# Patient Record
Sex: Female | Born: 1990 | Race: White | Hispanic: No | Marital: Single | Smoking: Never smoker
Health system: Southern US, Community
[De-identification: ages and names within clinical notes are randomized; demographics above are authoritative.]

## PROBLEM LIST (undated history)

## (undated) ENCOUNTER — Inpatient Hospital Stay (HOSPITAL_COMMUNITY): Payer: Self-pay

## (undated) DIAGNOSIS — T7840XA Allergy, unspecified, initial encounter: Secondary | ICD-10-CM

## (undated) DIAGNOSIS — D649 Anemia, unspecified: Secondary | ICD-10-CM

## (undated) DIAGNOSIS — O039 Complete or unspecified spontaneous abortion without complication: Secondary | ICD-10-CM

## (undated) DIAGNOSIS — M1990 Unspecified osteoarthritis, unspecified site: Secondary | ICD-10-CM

## (undated) HISTORY — PX: DILATION AND CURETTAGE OF UTERUS: SHX78

## (undated) HISTORY — DX: Unspecified osteoarthritis, unspecified site: M19.90

## (undated) HISTORY — DX: Anemia, unspecified: D64.9

## (undated) HISTORY — DX: Allergy, unspecified, initial encounter: T78.40XA

---
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
## 2009-06-10 ENCOUNTER — Other Ambulatory Visit: Admission: RE | Admit: 2009-06-10 | Discharge: 2009-06-10 | Payer: Self-pay | Admitting: Family Medicine

## 2009-08-05 ENCOUNTER — Encounter: Admission: RE | Admit: 2009-08-05 | Discharge: 2009-08-05 | Payer: Self-pay | Admitting: Family Medicine

## 2010-06-25 ENCOUNTER — Encounter: Payer: Self-pay | Admitting: *Deleted

## 2010-09-15 LAB — POCT I-STAT, CHEM 8
BUN: 10 mg/dL (ref 6–23)
Calcium, Ion: 1.23 mmol/L (ref 1.12–1.32)
Chloride: 103 mEq/L (ref 96–112)
Creatinine, Ser: 0.8 mg/dL (ref 0.4–1.2)
Glucose, Bld: 92 mg/dL (ref 70–99)
HCT: 43 % (ref 36.0–46.0)
Hemoglobin: 14.6 g/dL (ref 12.0–15.0)
Potassium: 4.3 mEq/L (ref 3.5–5.1)
Sodium: 138 mEq/L (ref 135–145)
TCO2: 26 mmol/L (ref 0–100)

## 2010-09-15 LAB — DIFFERENTIAL
Basophils Absolute: 0 10*3/uL (ref 0.0–0.1)
Basophils Relative: 0 % (ref 0–1)
Eosinophils Absolute: 0 10*3/uL (ref 0.0–0.7)
Eosinophils Relative: 0 % (ref 0–5)
Lymphocytes Relative: 23 % (ref 12–46)
Lymphs Abs: 1.8 10*3/uL (ref 0.7–4.0)
Monocytes Absolute: 0.7 10*3/uL (ref 0.1–1.0)
Monocytes Relative: 9 % (ref 3–12)
Neutro Abs: 5.3 10*3/uL (ref 1.7–7.7)
Neutrophils Relative %: 68 % (ref 43–77)

## 2010-09-15 LAB — CBC
HCT: 40.2 % (ref 36.0–46.0)
Hemoglobin: 13.9 g/dL (ref 12.0–15.0)
MCHC: 34.7 g/dL (ref 30.0–36.0)
MCV: 91.9 fL (ref 78.0–100.0)
Platelets: 193 10*3/uL (ref 150–400)
RBC: 4.38 MIL/uL (ref 3.87–5.11)
RDW: 12.9 % (ref 11.5–15.5)
WBC: 7.7 10*3/uL (ref 4.0–10.5)

## 2010-09-15 LAB — D-DIMER, QUANTITATIVE: D-Dimer, Quant: <0.22 ug/mL-FEU (ref 0.00–0.48)

## 2011-07-01 IMAGING — CT CT HEAD W/O CM
2 series · 16 of 30 positions shown, 20 images · non-contrast
Comparison: None

CLINICAL DATA: Headache.

CT HEAD WITHOUT CONTRAST
TECHNIQUE: Contiguous axial images were obtained from the base of
the skull through the vertex without contrast.

[Series 2: head wo · axial · 0.49mm/px · z∈[+380,+500]mm · 13 of 28 slices shown, 17 images]
[im 2/28  brain]
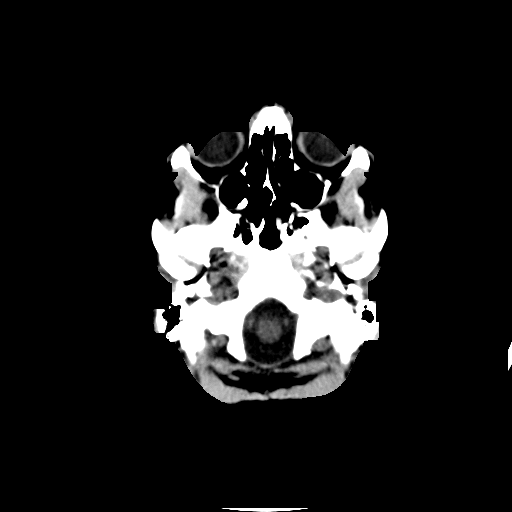
[im 2/28  bone]
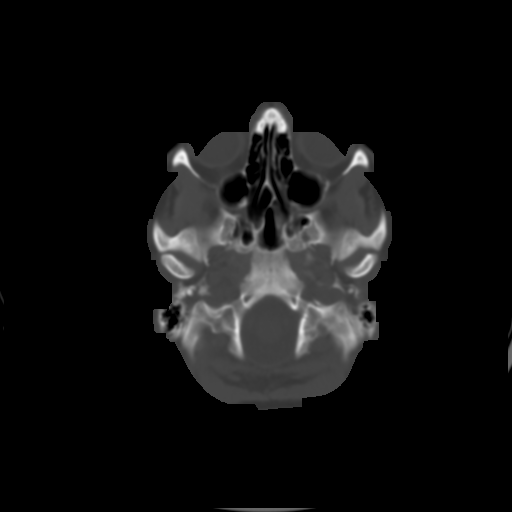
[im 4/28  brain]
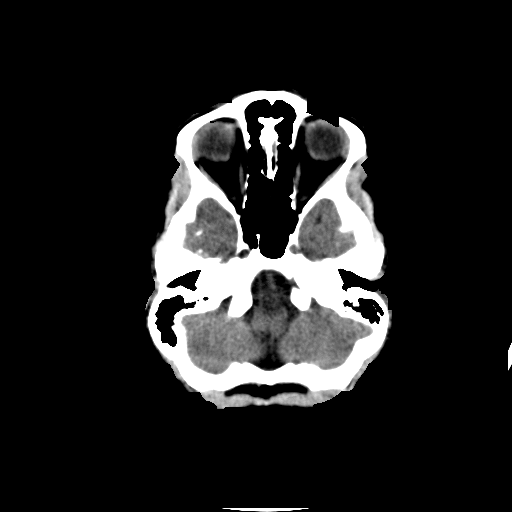
[im 6/28  brain]
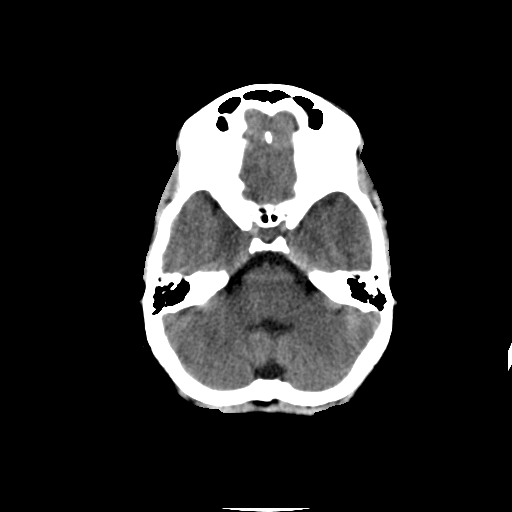
[im 8/28  brain]
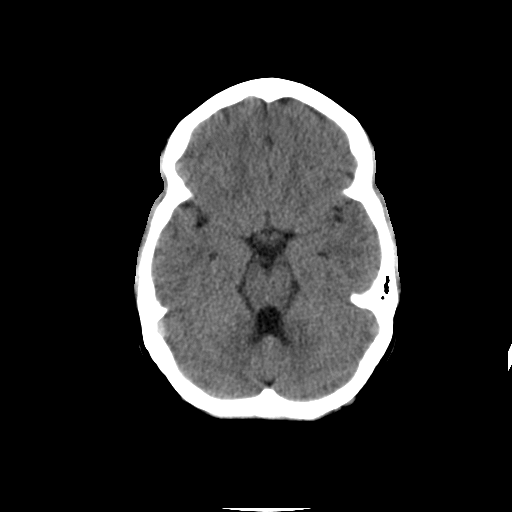
[im 10/28  brain]
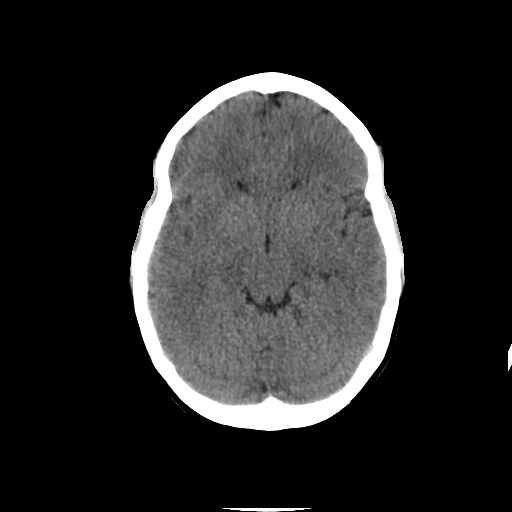
[im 10/28  bone]
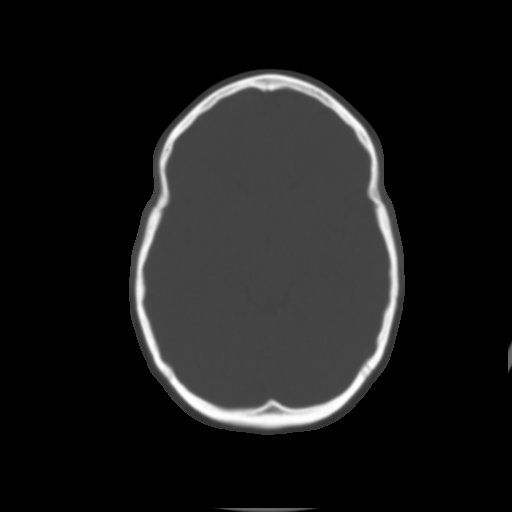
[im 12/28  brain]
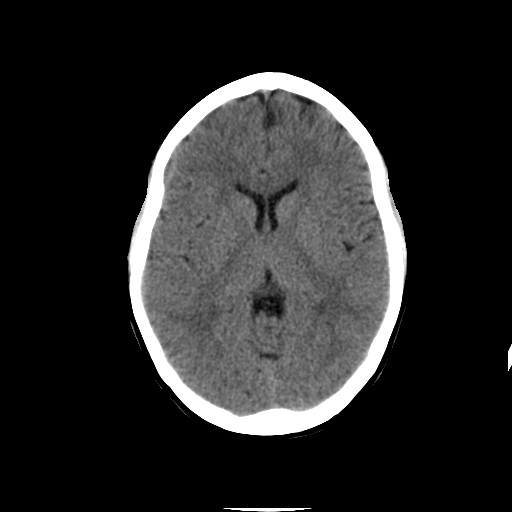
[im 14/28  brain]
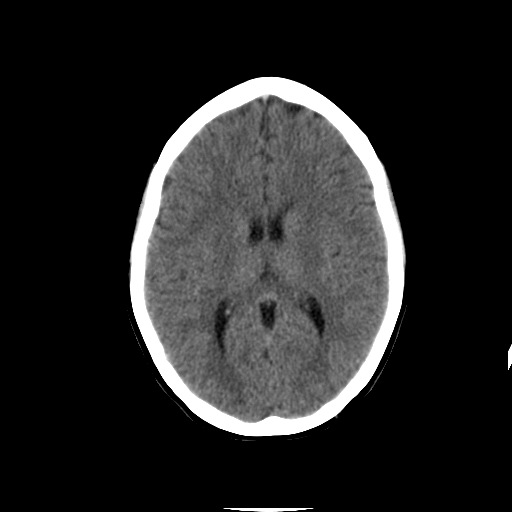
[im 16/28  brain]
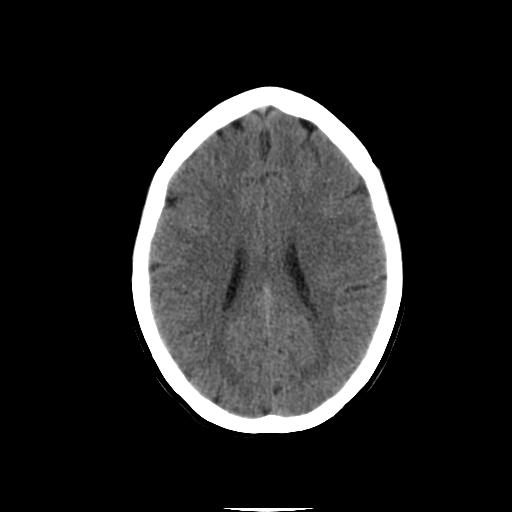
[im 18/28  brain]
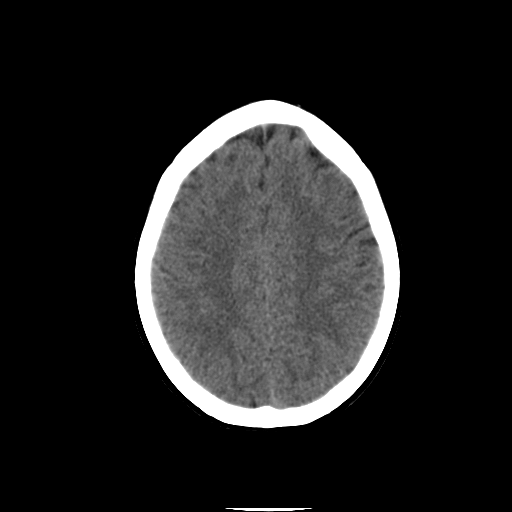
[im 18/28  bone]
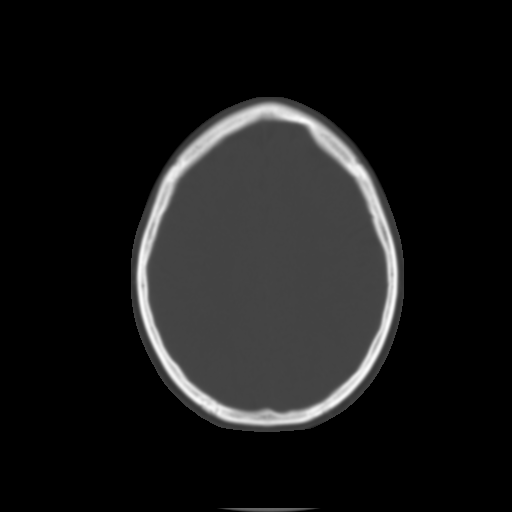
[im 20/28  brain]
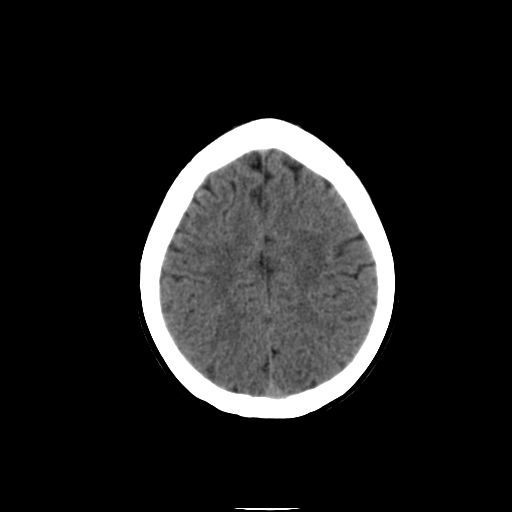
[im 22/28  brain]
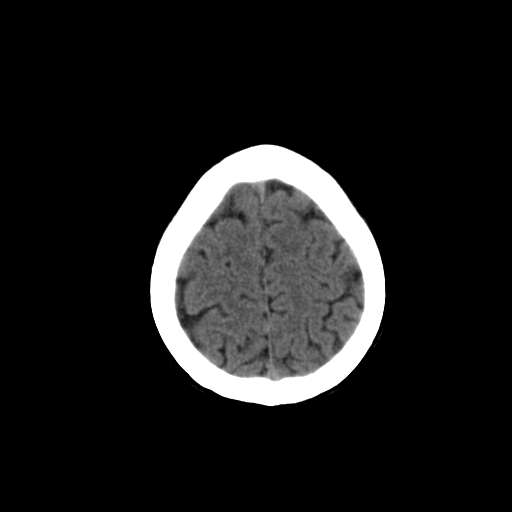
[im 24/28  brain]
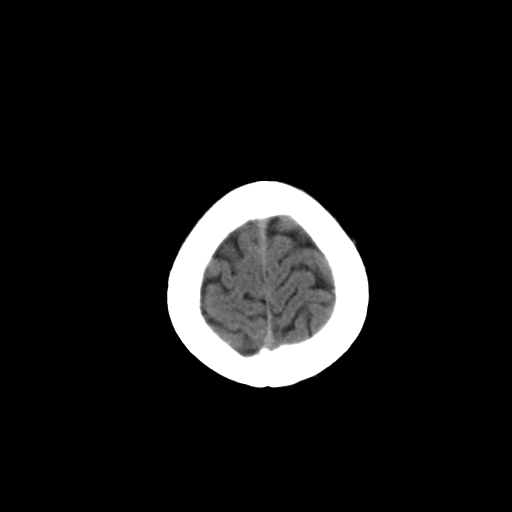
[im 26/28  brain]
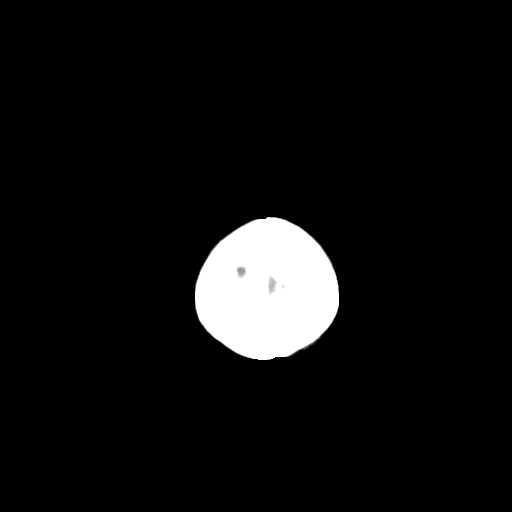
[im 26/28  bone]
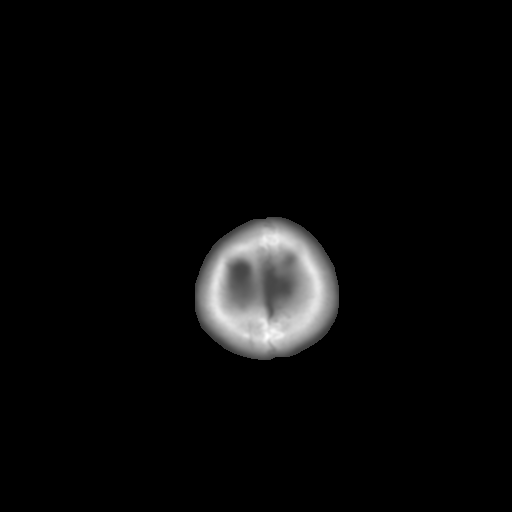

[Series 3: head bone · axial · 0.49mm/px · z∈[+380,+420]mm · 3 of 28 slices shown]
[im 2/28  bone]
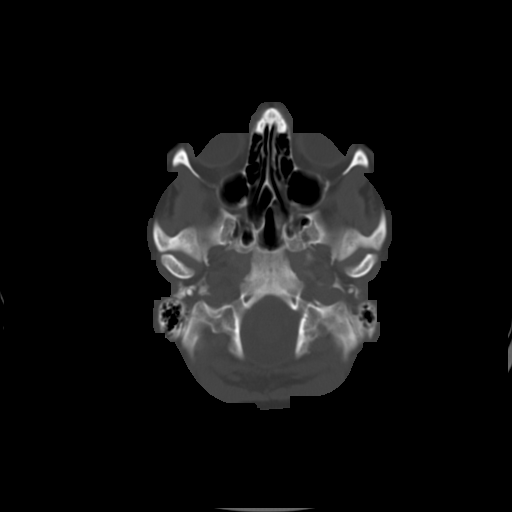
[im 6/28  bone]
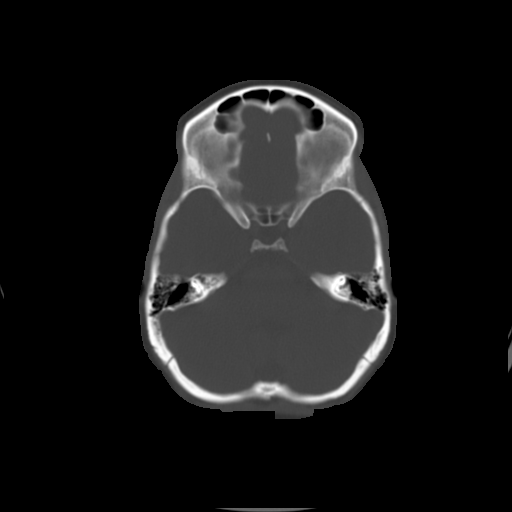
[im 10/28  bone]
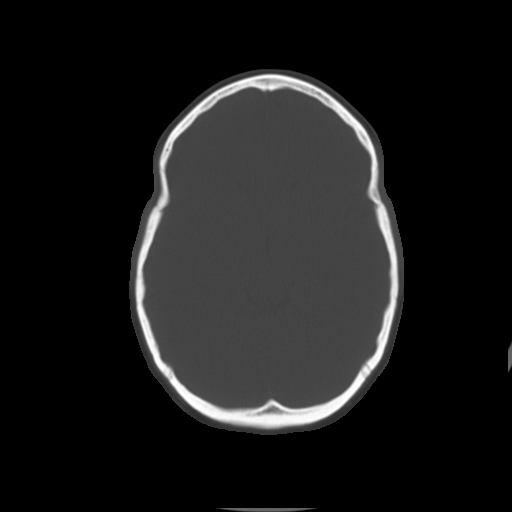

[16 of 30 positions shown; findings below may reference images not displayed]

FINDINGS: No acute intracranial abnormality.  Specifically, no
hemorrhage, hydrocephalus, mass lesion, acute infarction, or
significant intracranial injury.  No acute calvarial abnormality.

Visualized paranasal sinuses and mastoids clear.  Orbital soft
tissues unremarkable.
IMPRESSION: Normal study.

## 2015-09-16 ENCOUNTER — Inpatient Hospital Stay (HOSPITAL_COMMUNITY)
Admission: AD | Admit: 2015-09-16 | Discharge: 2015-09-17 | Disposition: A | Discharge status: Home / Self Care | Payer: Self-pay | Source: Ambulatory Visit | Attending: Obstetrics & Gynecology | Admitting: Obstetrics & Gynecology

## 2015-09-16 ENCOUNTER — Encounter (HOSPITAL_COMMUNITY): Payer: Self-pay

## 2015-09-16 DIAGNOSIS — M546 Pain in thoracic spine: Secondary | ICD-10-CM | POA: Insufficient documentation

## 2015-09-16 DIAGNOSIS — Z3A22 22 weeks gestation of pregnancy: Secondary | ICD-10-CM | POA: Insufficient documentation

## 2015-09-16 DIAGNOSIS — O26892 Other specified pregnancy related conditions, second trimester: Secondary | ICD-10-CM | POA: Insufficient documentation

## 2015-09-16 DIAGNOSIS — R8271 Bacteriuria: Secondary | ICD-10-CM | POA: Diagnosis present

## 2015-09-16 LAB — URINALYSIS, ROUTINE W REFLEX MICROSCOPIC
Bilirubin Urine: NEGATIVE
Glucose, UA: NEGATIVE mg/dL
Hgb urine dipstick: NEGATIVE
Ketones, ur: NEGATIVE mg/dL
Nitrite: NEGATIVE
Protein, ur: NEGATIVE mg/dL
Specific Gravity, Urine: 1.025 (ref 1.005–1.030)
pH: 6 (ref 5.0–8.0)

## 2015-09-16 LAB — URINE MICROSCOPIC-ADD ON

## 2015-09-16 NOTE — MAU Note (Signed)
Pt presents complaining of left lower back pain for a few weeks. Gets care in [place] and has not discussed with them. Not taking any pain medication. Denies vaginal bleeding or discharge. Reports good fetal movement.

## 2015-09-17 ENCOUNTER — Encounter (HOSPITAL_COMMUNITY): Payer: Self-pay

## 2015-09-17 DIAGNOSIS — M546 Pain in thoracic spine: Secondary | ICD-10-CM

## 2015-09-17 NOTE — MAU Provider Note (Signed)
History     CSN: [id number]  Arrival date and time: 09/16/15 2140   None     Chief Complaint  Patient presents with  . Back Pain   HPI Patient is 25 y.o. [id number]  here with complaints of Left sided back pain.  She notes that back pain has been present for a couple of weeks. She has not seen anyone for this yet.  She has not taken any medications for pain.  No preceding injury, no hematuria, no dysuria, fevers, chills, vomiting.  She reports that she is staying well hydrated.  +FM, denies LOF, VB, contractions, abnormal vaginal discharge.  Sees [facility] at [facility].  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    OB History    Gravida Para Term Preterm AB TAB SAB Ectopic Multiple Living   1               No past medical history on file.  No past surgical history on file.  No family history on file.  Social History  Substance Use Topics  . Smoking status: Not on file  . Smokeless tobacco: Not on file  . Alcohol Use: Not on file    Allergies: Allergies not on file  No prescriptions prior to admission    Review of Systems  Constitutional: Negative for fever, chills and diaphoresis.  HENT: Negative for congestion.   Respiratory: Negative for cough.   Gastrointestinal: Positive for nausea. Negative for vomiting and abdominal pain.  Genitourinary: Negative for dysuria and hematuria.  Musculoskeletal: Positive for back pain (left sided). Negative for falls.   Physical Exam   Blood pressure 112/62, pulse 83, temperature 98.3 F (36.8 C), temperature source Oral, resp. rate 18.  Physical Exam  Constitutional: She is oriented to person, place, and time. She appears well-developed and well-nourished. No distress.  HENT:  Head: Normocephalic.  Mouth/Throat: Oropharynx is clear and moist.  Eyes: EOM are normal. No scleral icterus.  Neck: Normal range of motion.  Cardiovascular: Normal rate, regular rhythm, normal heart sounds and intact distal pulses.   No murmur  heard. Respiratory: Effort normal and breath sounds normal. No respiratory distress. She has no rales.  GI: Soft. There is no tenderness.  Musculoskeletal: Normal range of motion. She exhibits no edema.  Has full Thoracic ROM.  No pain with ROM testing.  No midline TTP to thoracic spine.  No paraspinal TTP. +mild left sided CVA TTP  Neurological: She is alert and oriented to person, place, and time.  Skin: Skin is warm.  Psychiatric: She has a normal mood and affect. Her behavior is normal. Judgment and thought content normal.   FHR: 154 via doppler  Results for orders placed or performed during the hospital encounter of 09/16/15 (from the past 24 hour(s))  Urinalysis, Routine w reflex microscopic (not at [facility])     Status: Abnormal   Collection Time: 09/16/15 10:10 PM  Result Value Ref Range   Color, Urine YELLOW YELLOW   APPearance CLEAR CLEAR   Specific Gravity, Urine 1.025 1.005 - 1.030   pH 6.0 5.0 - 8.0   Glucose, UA NEGATIVE NEGATIVE mg/dL   Hgb urine dipstick NEGATIVE NEGATIVE   Bilirubin Urine NEGATIVE NEGATIVE   Ketones, ur NEGATIVE NEGATIVE mg/dL   Protein, ur NEGATIVE NEGATIVE mg/dL   Nitrite NEGATIVE NEGATIVE   Leukocytes, UA TRACE (A) NEGATIVE  Urine microscopic-add on     Status: Abnormal   Collection Time: 09/16/15 10:10 PM  Result Value Ref Range  Squamous Epithelial / LPF 0-5 (A) NONE SEEN   WBC, UA 0-5 0 - 5 WBC/hpf   RBC / HPF 0-5 0 - 5 RBC/hpf   Bacteria, UA FEW (A) NONE SEEN   MAU Course  Procedures  MDM 0100: non toxic.  UA with small bacteria and leuks.  OB culture sent.  Exam unrevealing.    Assessment and Plan   Left-sided thoracic back pain - No evidence of pyelonephritis - UA with small leuk/ bacteria.  THis was sent for culture. - Patient non toxic appearing - FHR reassuring - Tylenol, heat, stretching prn - Return precautions discussed  Delynn Flavinshly Gottschalk, DO 09/17/2015, 12:51 AM   OB FELLOW MAU DISCHARGE ATTESTATION  I have seen and  examined this patient; I agree with above documentation in the resident's note.    Silvano BilisNoah B Rashell Shambaugh, MD 2:42 AM

## 2015-09-17 NOTE — Discharge Instructions (Signed)
You were seen in the ED for left sided back pain.  No evidence of kidney infection.  Likely having muscle related pain.  You may use heat, take tylenol, stretch.  See your provider as scheduled for routine care.  Back Exercises If you have pain in your back, do these exercises 2-3 times each day or as told by your doctor. When the pain goes away, do the exercises once each day, but repeat the steps more times for each exercise (do more repetitions). If you do not have pain in your back, do these exercises once each day or as told by your doctor. EXERCISES Single Knee to Chest Do these steps 3-5 times in a row for each leg: 1. Lie on your back on a firm bed or the floor with your legs stretched out. 2. Bring one knee to your chest. 3. Hold your knee to your chest by grabbing your knee or thigh. 4. Pull on your knee until you feel a gentle stretch in your lower back. 5. Keep doing the stretch for 10-30 seconds. 6. Slowly let go of your leg and straighten it. Pelvic Tilt Do these steps 5-10 times in a row: 1. Lie on your back on a firm bed or the floor with your legs stretched out. 2. Bend your knees so they point up to the ceiling. Your feet should be flat on the floor. 3. Tighten your lower belly (abdomen) muscles to press your lower back against the floor. This will make your tailbone point up to the ceiling instead of pointing down to your feet or the floor. 4. Stay in this position for 5-10 seconds while you gently tighten your muscles and breathe evenly. Cat-Cow Do these steps until your lower back bends more easily: 1. Get on your hands and knees on a firm surface. Keep your hands under your shoulders, and keep your knees under your hips. You may put padding under your knees. 2. Let your head hang down, and make your tailbone point down to the floor so your lower back is round like the back of a cat. 3. Stay in this position for 5 seconds. 4. Slowly lift your head and make your tailbone  point up to the ceiling so your back hangs low (sags) like the back of a cow. 5. Stay in this position for 5 seconds. Press-Ups Do these steps 5-10 times in a row: 1. Lie on your belly (face-down) on the floor. 2. Place your hands near your head, about shoulder-width apart. 3. While you keep your back relaxed and keep your hips on the floor, slowly straighten your arms to raise the top half of your body and lift your shoulders. Do not use your back muscles. To make yourself more comfortable, you may change where you place your hands. 4. Stay in this position for 5 seconds. 5. Slowly return to lying flat on the floor. Bridges Do these steps 10 times in a row: 1. Lie on your back on a firm surface. 2. Bend your knees so they point up to the ceiling. Your feet should be flat on the floor. 3. Tighten your butt muscles and lift your butt off of the floor until your waist is almost as high as your knees. If you do not feel the muscles working in your butt and the back of your thighs, slide your feet 1-2 inches farther away from your butt. 4. Stay in this position for 3-5 seconds. 5. Slowly lower your butt to the floor,  and let your butt muscles relax. If this exercise is too easy, try doing it with your arms crossed over your chest. Belly Crunches Do these steps 5-10 times in a row: 1. Lie on your back on a firm bed or the floor with your legs stretched out. 2. Bend your knees so they point up to the ceiling. Your feet should be flat on the floor. 3. Cross your arms over your chest. 4. Tip your chin a little bit toward your chest but do not bend your neck. 5. Tighten your belly muscles and slowly raise your chest just enough to lift your shoulder blades a tiny bit off of the floor. 6. Slowly lower your chest and your head to the floor. Back Lifts Do these steps 5-10 times in a row: 1. Lie on your belly (face-down) with your arms at your sides, and rest your forehead on the floor. 2. Tighten the  muscles in your legs and your butt. 3. Slowly lift your chest off of the floor while you keep your hips on the floor. Keep the back of your head in line with the curve in your back. Look at the floor while you do this. 4. Stay in this position for 3-5 seconds. 5. Slowly lower your chest and your face to the floor. GET HELP IF:  Your back pain gets a lot worse when you do an exercise.  Your back pain does not lessen 2 hours after you exercise. If you have any of these problems, stop doing the exercises. Do not do them again unless your doctor says it is okay. GET HELP RIGHT AWAY IF:  You have sudden, very bad back pain. If this happens, stop doing the exercises. Do not do them again unless your doctor says it is okay.   This information is not intended to replace advice given to you by your health care provider. Make sure you discuss any questions you have with your health care provider.   Document Released: 06/24/2010 Document Revised: 02/10/2015 Document Reviewed: 07/16/2014 Elsevier Interactive Patient Education [employer]

## 2015-09-19 LAB — CULTURE, OB URINE

## 2015-09-20 DIAGNOSIS — R8271 Bacteriuria: Secondary | ICD-10-CM | POA: Diagnosis present

## 2015-10-20 DIAGNOSIS — R87810 Cervical high risk human papillomavirus (HPV) DNA test positive: Secondary | ICD-10-CM | POA: Insufficient documentation

## 2016-01-17 DIAGNOSIS — Z3490 Encounter for supervision of normal pregnancy, unspecified, unspecified trimester: Secondary | ICD-10-CM | POA: Insufficient documentation

## 2016-07-21 ENCOUNTER — Encounter (HOSPITAL_COMMUNITY): Payer: Self-pay

## 2017-10-30 ENCOUNTER — Emergency Department (HOSPITAL_COMMUNITY)
Admission: EM | Admit: 2017-10-30 | Discharge: 2017-10-30 | Disposition: A | Discharge status: Home / Self Care | Payer: Self-pay | Attending: Emergency Medicine | Admitting: Emergency Medicine

## 2017-10-30 ENCOUNTER — Encounter (HOSPITAL_COMMUNITY): Payer: Self-pay

## 2017-10-30 ENCOUNTER — Other Ambulatory Visit: Payer: Self-pay

## 2017-10-30 DIAGNOSIS — N946 Dysmenorrhea, unspecified: Secondary | ICD-10-CM | POA: Insufficient documentation

## 2017-10-30 DIAGNOSIS — O200 Threatened abortion: Secondary | ICD-10-CM | POA: Insufficient documentation

## 2017-10-30 HISTORY — DX: Complete or unspecified spontaneous abortion without complication: O03.9

## 2017-10-30 LAB — URINALYSIS, ROUTINE W REFLEX MICROSCOPIC
Bilirubin Urine: NEGATIVE
Glucose, UA: NEGATIVE mg/dL
Ketones, ur: NEGATIVE mg/dL
Leukocytes, UA: NEGATIVE
Nitrite: NEGATIVE
Protein, ur: NEGATIVE mg/dL
Specific Gravity, Urine: 1.018 (ref 1.005–1.030)
pH: 6 (ref 5.0–8.0)

## 2017-10-30 LAB — COMPREHENSIVE METABOLIC PANEL
ALT: 13 U/L — ABNORMAL LOW (ref 14–54)
AST: 16 U/L (ref 15–41)
Albumin: 4.6 g/dL (ref 3.5–5.0)
Alkaline Phosphatase: 35 U/L — ABNORMAL LOW (ref 38–126)
Anion gap: 9 (ref 5–15)
BUN: 10 mg/dL (ref 6–20)
CO2: 25 mmol/L (ref 22–32)
Calcium: 9.2 mg/dL (ref 8.9–10.3)
Chloride: 103 mmol/L (ref 101–111)
Creatinine, Ser: 0.61 mg/dL (ref 0.44–1.00)
GFR calc Af Amer: >60 mL/min (ref >60)
GFR calc non Af Amer: >60 mL/min (ref >60)
Glucose, Bld: 140 mg/dL — ABNORMAL HIGH (ref 65–99)
Potassium: 3.7 mmol/L (ref 3.5–5.1)
Sodium: 137 mmol/L (ref 135–145)
Total Bilirubin: 1.5 mg/dL — ABNORMAL HIGH (ref 0.3–1.2)
Total Protein: 7.3 g/dL (ref 6.5–8.1)

## 2017-10-30 LAB — LIPASE, BLOOD: Lipase: 24 U/L (ref 11–51)

## 2017-10-30 LAB — CBC
HCT: 38.7 % (ref 36.0–46.0)
Hemoglobin: 12.9 g/dL (ref 12.0–15.0)
MCH: 31.5 pg (ref 26.0–34.0)
MCHC: 33.3 g/dL (ref 30.0–36.0)
MCV: 94.4 fL (ref 78.0–100.0)
Platelets: 235 10*3/uL (ref 150–400)
RBC: 4.1 MIL/uL (ref 3.87–5.11)
RDW: 12.6 % (ref 11.5–15.5)
WBC: 4.9 10*3/uL (ref 4.0–10.5)

## 2017-10-30 LAB — WET PREP, GENITAL
Sperm: NONE SEEN
Trich, Wet Prep: NONE SEEN
Yeast Wet Prep HPF POC: NONE SEEN

## 2017-10-30 LAB — I-STAT BETA HCG BLOOD, ED (MC, WL, AP ONLY): I-stat hCG, quantitative: <5 m[IU]/mL (ref <5)

## 2017-10-30 NOTE — Discharge Instructions (Signed)
Your blood test today shows no pregnancy hormone. Follow up with the [facility] hospital for further evaluation. Take tylenol and ibuprofen as needed for pain.

## 2017-10-30 NOTE — ED Triage Notes (Addendum)
Patient c/o spotting yesterday with a light abdominal cramping, today c/o large amounts of vaginal bleeding with clots and abdominal cramps are worse. 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

## 2017-10-30 NOTE — ED Provider Notes (Signed)
[facility]-EMERGENCY DEPT Provider Note   CSN: [id number] Arrival date & time: 10/30/17  1326     History   Chief Complaint Chief Complaint  Patient presents with  . Vaginal Bleeding    HPI Teresa Lucas is a 27 y.o. female [id number] presents to the ED with vaginal bleeding. Patient reports that her LMP was in April and had a positive HPT last week. Yesterday started spotting and cramping. Today bleeding increased and some clots. Patient with hx of SAB x 3.   HPI  Past Medical History:  Diagnosis Date  . Miscarriage     Patient Active Problem List   Diagnosis Date Noted  . Group B streptococcal bacteriuria 09/20/2015  . SYNCOPE 08/19/2008  . DYSPNEA 08/19/2008    Past Surgical History:  Procedure Laterality Date  . DILATION AND CURETTAGE OF UTERUS       OB History    Gravida  4   Para  0   Term      Preterm      AB  3   Living  0     SAB  3   TAB      Ectopic      Multiple      Live Births               Home Medications    Prior to Admission medications   Not on File    Family History Family History  Problem Relation Age of Onset  . COPD Father     Social History Social History   Tobacco Use  . Smoking status: Never Smoker  . Smokeless tobacco: Never Used  Substance Use Topics  . Alcohol use: Yes    Comment: socially  . Drug use: Never     Allergies   Shellfish allergy   Review of Systems Review of Systems  Gastrointestinal: Abdominal pain: cramping.  Genitourinary: Positive for vaginal bleeding.  All other systems reviewed and are negative.    Physical Exam Updated Vital Signs BP 114/86 (BP Location: Right Arm)   Pulse 75   Temp 98.4 F (36.9 C) (Oral)   Resp 18   Ht  (1.575 m)   Wt 49 kg (108 lb)   LMP 09/12/2017   SpO2 100%   BMI 19.75 kg/m   Physical Exam  Constitutional: She appears well-developed and well-nourished. No distress.  HENT:  Head: Normocephalic.    Eyes: EOM are normal.  Neck: Neck supple.  Cardiovascular: Normal rate.  Pulmonary/Chest: Effort normal.  Abdominal: Soft. There is no tenderness.  Genitourinary:  Genitourinary Comments: External genitalia without lesions, moderate blood vaginal vault. Cervix closed, no CMT, no adnexal tenderness or mass palpated. Uterus without palpable enlargement.   Musculoskeletal: Normal range of motion.  Neurological: She is alert.  Skin: Skin is warm and dry.  Psychiatric: She has a normal mood and affect. Her behavior is normal.  Nursing note and vitals reviewed.    ED Treatments / Results  Labs (all labs ordered are listed, but only abnormal results are displayed) Labs Reviewed  WET PREP, GENITAL - Abnormal; Notable for the following components:      Result Value   Clue Cells Wet Prep HPF POC PRESENT (*)    WBC, Wet Prep HPF POC FEW (*)    All other components within normal limits  COMPREHENSIVE METABOLIC PANEL - Abnormal; Notable for the following components:   Glucose, Bld 140 (*)    ALT 13 (*)  Alkaline Phosphatase 35 (*)    Total Bilirubin 1.5 (*)    All other components within normal limits  URINALYSIS, ROUTINE W REFLEX MICROSCOPIC - Abnormal; Notable for the following components:   APPearance HAZY (*)    Hgb urine dipstick LARGE (*)    Bacteria, UA FEW (*)    All other components within normal limits  LIPASE, BLOOD  CBC  I-STAT BETA HCG BLOOD, ED ([facility], [facility], [facility] ONLY)  GC/CHLAMYDIA PROBE AMP ([place]) NOT AT [facility]   Radiology No results found.  Procedures Procedures (including critical care time)  Medications Ordered in ED Medications - No data to display   Initial Impression / Assessment and Plan / ED Course  I have reviewed the triage vital signs and the nursing notes.  27 y.o. female here for vaginal bleeding after having a positive HPT a few days ago and being later for her period stable for d/c with Bhcg <5 and no hemorrhaging, normal CBC. Discussed with  the patient lab results and need for f/u with GYN. Referral given to [facility]. Patient agrees with plan.  Final Clinical Impressions(s) / ED Diagnoses   Final diagnoses:  Dysmenorrhea    ED Discharge Orders    None       Kerrie Buffalo Rule, [place] 10/30/17 Judithann Sauger, MD 10/30/17 [phone]

## 2017-10-31 LAB — GC/CHLAMYDIA PROBE AMP (~~LOC~~) NOT AT ARMC
Chlamydia: NEGATIVE
Neisseria Gonorrhea: NEGATIVE

## 2018-03-13 DIAGNOSIS — Z113 Encounter for screening for infections with a predominantly sexual mode of transmission: Secondary | ICD-10-CM | POA: Diagnosis not present

## 2018-03-13 DIAGNOSIS — Z3480 Encounter for supervision of other normal pregnancy, unspecified trimester: Secondary | ICD-10-CM | POA: Diagnosis not present

## 2018-03-13 DIAGNOSIS — R87612 Low grade squamous intraepithelial lesion on cytologic smear of cervix (LGSIL): Secondary | ICD-10-CM | POA: Diagnosis not present

## 2018-03-13 DIAGNOSIS — Z124 Encounter for screening for malignant neoplasm of cervix: Secondary | ICD-10-CM | POA: Diagnosis not present

## 2018-03-13 DIAGNOSIS — O26849 Uterine size-date discrepancy, unspecified trimester: Secondary | ICD-10-CM | POA: Diagnosis not present

## 2018-04-10 DIAGNOSIS — O2342 Unspecified infection of urinary tract in pregnancy, second trimester: Secondary | ICD-10-CM | POA: Diagnosis not present

## 2018-04-10 DIAGNOSIS — Z3482 Encounter for supervision of other normal pregnancy, second trimester: Secondary | ICD-10-CM | POA: Diagnosis not present

## 2018-04-12 DIAGNOSIS — Z363 Encounter for antenatal screening for malformations: Secondary | ICD-10-CM | POA: Diagnosis not present

## 2018-04-12 DIAGNOSIS — O289 Unspecified abnormal findings on antenatal screening of mother: Secondary | ICD-10-CM | POA: Diagnosis not present

## 2018-04-12 DIAGNOSIS — Z3A15 15 weeks gestation of pregnancy: Secondary | ICD-10-CM | POA: Diagnosis not present

## 2018-04-12 DIAGNOSIS — O288 Other abnormal findings on antenatal screening of mother: Secondary | ICD-10-CM | POA: Diagnosis not present

## 2018-04-12 DIAGNOSIS — O280 Abnormal hematological finding on antenatal screening of mother: Secondary | ICD-10-CM | POA: Diagnosis not present

## 2018-05-01 DIAGNOSIS — Z315 Encounter for genetic counseling: Secondary | ICD-10-CM | POA: Diagnosis not present

## 2018-05-06 DIAGNOSIS — O289 Unspecified abnormal findings on antenatal screening of mother: Secondary | ICD-10-CM | POA: Diagnosis not present

## 2018-05-06 DIAGNOSIS — Z3A19 19 weeks gestation of pregnancy: Secondary | ICD-10-CM | POA: Diagnosis not present

## 2018-05-06 DIAGNOSIS — Z362 Encounter for other antenatal screening follow-up: Secondary | ICD-10-CM | POA: Diagnosis not present

## 2018-05-08 DIAGNOSIS — O2342 Unspecified infection of urinary tract in pregnancy, second trimester: Secondary | ICD-10-CM | POA: Diagnosis not present

## 2018-06-05 NOTE — L&D Delivery Note (Signed)
Patient was C/C/+3 and pushed for <5 minutes with no  epidural.    NSVD female infant, Apgars pending, weight pending.  Baby vigorus and crying at delivery   The patient had no laceration. Fundus was firm. EBL was expected amount. Placenta was delivered intact. Vagina was clear.  Delayed cord clamping done for 30-60 seconds while warming baby. Baby was vigorous and doing skin to skin with mother.  Philip Aspen

## 2018-06-18 ENCOUNTER — Encounter (HOSPITAL_BASED_OUTPATIENT_CLINIC_OR_DEPARTMENT_OTHER): Payer: Self-pay | Admitting: *Deleted

## 2018-06-18 ENCOUNTER — Emergency Department (HOSPITAL_BASED_OUTPATIENT_CLINIC_OR_DEPARTMENT_OTHER)
Admission: EM | Admit: 2018-06-18 | Discharge: 2018-06-18 | Disposition: A | Discharge status: Home / Self Care | Payer: Medicaid Other | Attending: Emergency Medicine | Admitting: Emergency Medicine

## 2018-06-18 ENCOUNTER — Other Ambulatory Visit: Payer: Self-pay

## 2018-06-18 DIAGNOSIS — R42 Dizziness and giddiness: Secondary | ICD-10-CM | POA: Diagnosis not present

## 2018-06-18 DIAGNOSIS — Z79899 Other long term (current) drug therapy: Secondary | ICD-10-CM | POA: Insufficient documentation

## 2018-06-18 DIAGNOSIS — R55 Syncope and collapse: Secondary | ICD-10-CM

## 2018-06-18 DIAGNOSIS — W19XXXA Unspecified fall, initial encounter: Secondary | ICD-10-CM | POA: Diagnosis not present

## 2018-06-18 DIAGNOSIS — I959 Hypotension, unspecified: Secondary | ICD-10-CM | POA: Diagnosis not present

## 2018-06-18 LAB — BASIC METABOLIC PANEL
Anion gap: 7 (ref 5–15)
BUN: 8 mg/dL (ref 6–20)
CO2: 21 mmol/L — ABNORMAL LOW (ref 22–32)
Calcium: 8.3 mg/dL — ABNORMAL LOW (ref 8.9–10.3)
Chloride: 105 mmol/L (ref 98–111)
Creatinine, Ser: 0.47 mg/dL (ref 0.44–1.00)
GFR calc Af Amer: >60 mL/min (ref >60)
Glucose, Bld: 106 mg/dL — ABNORMAL HIGH (ref 70–99)
Potassium: 3.1 mmol/L — ABNORMAL LOW (ref 3.5–5.1)
Sodium: 133 mmol/L — ABNORMAL LOW (ref 135–145)

## 2018-06-18 LAB — CBC WITH DIFFERENTIAL/PLATELET
Abs Immature Granulocytes: 0.05 10*3/uL (ref 0.00–0.07)
Basophils Absolute: 0 10*3/uL (ref 0.0–0.1)
Basophils Relative: 0 %
Eosinophils Absolute: 0.1 10*3/uL (ref 0.0–0.5)
Eosinophils Relative: 1 %
HCT: 32.6 % — ABNORMAL LOW (ref 36.0–46.0)
Hemoglobin: 10.3 g/dL — ABNORMAL LOW (ref 12.0–15.0)
IMMATURE GRANULOCYTES: 1 %
Lymphocytes Relative: 13 %
Lymphs Abs: 1.3 10*3/uL (ref 0.7–4.0)
MCH: 29.6 pg (ref 26.0–34.0)
MCHC: 31.6 g/dL (ref 30.0–36.0)
MCV: 93.7 fL (ref 80.0–100.0)
Monocytes Absolute: 0.5 10*3/uL (ref 0.1–1.0)
Monocytes Relative: 5 %
NEUTROS PCT: 80 %
Neutro Abs: 8 10*3/uL — ABNORMAL HIGH (ref 1.7–7.7)
Platelets: 186 10*3/uL (ref 150–400)
RBC: 3.48 MIL/uL — ABNORMAL LOW (ref 3.87–5.11)
RDW: 12.1 % (ref 11.5–15.5)
WBC: 10 10*3/uL (ref 4.0–10.5)
nRBC: 0 % (ref 0.0–0.2)

## 2018-06-18 LAB — URINALYSIS, ROUTINE W REFLEX MICROSCOPIC
Bilirubin Urine: NEGATIVE
Glucose, UA: NEGATIVE mg/dL
Hgb urine dipstick: NEGATIVE
Ketones, ur: NEGATIVE mg/dL
Leukocytes, UA: NEGATIVE
Nitrite: NEGATIVE
Protein, ur: 30 mg/dL — AB
Specific Gravity, Urine: >1.03 — ABNORMAL HIGH (ref 1.005–1.030)
pH: 5 (ref 5.0–8.0)

## 2018-06-18 LAB — URINALYSIS, MICROSCOPIC (REFLEX)

## 2018-06-18 LAB — CBG MONITORING, ED: Glucose-Capillary: 117 mg/dL — ABNORMAL HIGH (ref 70–99)

## 2018-06-18 MED ORDER — SODIUM CHLORIDE 0.9 % IV BOLUS
1000.0000 mL | Freq: Once | INTRAVENOUS | Status: AC
  Administered 2018-06-18: 1000 mL via INTRAVENOUS

## 2018-06-18 MED ORDER — POTASSIUM CHLORIDE CRYS ER 20 MEQ PO TBCR
40.0000 meq | EXTENDED_RELEASE_TABLET | Freq: Once | ORAL | Status: AC
  Administered 2018-06-18: 40 meq via ORAL
  Filled 2018-06-18: qty 2

## 2018-06-18 NOTE — ED Notes (Signed)
Pt reports feeling weak. Denies feeling dizzy during orthostatics. Earlier during episode- pt was at work and felt naseuous in the way to the bathroom she passed out. Pt denies hitting her head. Pt c/o shoulder pain. Pt ate breakfast and trys to stay hydrated.

## 2018-06-18 NOTE — ED Provider Notes (Addendum)
[facility] EMERGENCY DEPARTMENT Provider Note   CSN: [id number] Arrival date & time: 06/18/18  1243     History   Chief Complaint Chief Complaint  Patient presents with  . Fall    HPI Teresa Lucas is a 27 y.o. female  G5 P1031 at [redacted] weeks gestation presenting to the emergency department with chief complaint of syncopal episode. The episode was acute, happening a few hours ago.  Patient was feeling nauseous at work and walked to the bathroom incase she needed to vomit.  She felt lightheaded on the way there, so she leaned against the counter and then passed out. The fall was witnessed by her coworker, reportingthat she slid down the counter and did not strike her head.  She landed on her back.  When EMS arrived her blood sugar was 95 and BP was 90/60.  Patient states she did eat breakfast this morning and tries her best to stay hydrated. Pt denies any associated pain. She did not take medications prior to arrival. Patient denies any head pain, visual changes, abdominal pain, pelvic pain, vaginal bleeding, vaginal discharge.    Patient reports that this pregnancy has been uncomplicated, she has received regular prenatal care.  Patient reports a similar episode in her previous pregnancy.  She was out of town and did not go to the hospital or get an evaluation after that syncopal episode.  History provided by patient.  Past Medical History:  Diagnosis Date  . Miscarriage     Patient Active Problem List   Diagnosis Date Noted  . Group B streptococcal bacteriuria 09/20/2015  . SYNCOPE 08/19/2008  . DYSPNEA 08/19/2008    Past Surgical History:  Procedure Laterality Date  . DILATION AND CURETTAGE OF UTERUS       OB History    Gravida  5   Para  0   Term      Preterm      AB  3   Living  0     SAB  3   TAB      Ectopic      Multiple      Live Births               Home Medications    Prior to Admission medications   Medication Sig Start Date  End Date Taking? Authorizing Provider  nitrofurantoin, macrocrystal-monohydrate, (MACROBID) 100 MG capsule Take 100 mg by mouth 2 (two) times daily.   Yes [provider]  Prenatal Multivit-Min-Fe-FA (PRENATAL VITAMINS PO) Take by mouth.   Yes [provider]    Family History Family History  Problem Relation Age of Onset  . COPD Father     Social History Social History   Tobacco Use  . Smoking status: Never Smoker  . Smokeless tobacco: Never Used  Substance Use Topics  . Alcohol use: Not Currently    Comment: socially  . Drug use: Never     Allergies   Shellfish allergy   Review of Systems Review of Systems  Constitutional: Negative for chills and fever.  HENT: Negative for congestion, sinus pressure and sore throat.   Eyes: Negative for pain and visual disturbance.  Respiratory: Negative for chest tightness and shortness of breath.   Cardiovascular: Negative for chest pain and palpitations.  Gastrointestinal: Positive for nausea. Negative for abdominal pain, diarrhea and vomiting.  Endocrine: Negative for polyuria.  Genitourinary: Negative for difficulty urinating, dysuria, flank pain, hematuria, pelvic pain, vaginal bleeding, vaginal discharge and vaginal pain.  Musculoskeletal: Negative for arthralgias, back pain and neck pain.  Skin: Positive for wound (scratches on left sacupla). Negative for rash.  Neurological: Positive for light-headedness. Negative for syncope and headaches.     Physical Exam Updated Vital Signs BP 106/64   Pulse 84   Temp 98.1 F (36.7 C) (Oral)   Resp 20   Ht 5\' 2"  (1.575 m)   Wt 56.7 kg   LMP 09/12/2017   SpO2 100%   BMI 22.86 kg/m   Physical Exam Vitals signs and nursing note reviewed.  Constitutional:      Appearance: She is not ill-appearing or toxic-appearing.  HENT:     Head: Normocephalic and atraumatic.     Comments: Pt does not have any wounds, lacerations, bleeding, ecchymosis to her head.     Right Ear: Tympanic membrane normal.     Left Ear: Tympanic membrane normal.     Nose: Nose normal.     Mouth/Throat:     Mouth: Mucous membranes are moist.     Pharynx: Oropharynx is clear.  Eyes:     Conjunctiva/sclera: Conjunctivae normal.  Neck:     Musculoskeletal: Normal range of motion.  Cardiovascular:     Rate and Rhythm: Normal rate and regular rhythm.     Pulses: Normal pulses.          Radial pulses are 2+ on the right side and 2+ on the left side.     Heart sounds: Normal heart sounds.  Pulmonary:     Effort: Pulmonary effort is normal.     Breath sounds: Normal breath sounds.  Abdominal:     General: Bowel sounds are normal.     Tenderness: There is no abdominal tenderness. There is no guarding or rebound.     Comments: fundal height just above umbilicus.   Musculoskeletal: Normal range of motion.     Right lower leg: No edema.     Left lower leg: No edema.     Comments: No spinal tenderness  Skin:    General: Skin is warm and dry.     Capillary Refill: Capillary refill takes less than 2 seconds.  Neurological:     Mental Status: She is alert and oriented to person, place, and time.     Comments: Speech is clear and goal oriented, follows commands CN III-XII intact, no facial droop Normal strength in upper and lower extremities bilaterally including dorsiflexion and plantar flexion, strong and equal grip strength Sensation normal to light and sharp touch Moves extremities without ataxia, coordination intact Normal finger to nose and rapid alternating movements Normal gait and balance  Psychiatric:        Behavior: Behavior normal.      ED Treatments / Results  Labs (all labs ordered are listed, but only abnormal results are displayed) Labs Reviewed  URINALYSIS, ROUTINE W REFLEX MICROSCOPIC - Abnormal; Notable for the following components:      Result Value   APPearance CLOUDY (*)    Specific Gravity, Urine >1.030 (*)    Protein, ur 30 (*)    All  other components within normal limits  CBC WITH DIFFERENTIAL/PLATELET - Abnormal; Notable for the following components:   RBC 3.48 (*)    Hemoglobin 10.3 (*)    HCT 32.6 (*)    Neutro Abs 8.0 (*)    All other components within normal limits  BASIC METABOLIC PANEL - Abnormal; Notable for the following components:   Sodium 133 (*)    Potassium 3.1 (*)  CO2 21 (*)    Glucose, Bld 106 (*)    Calcium 8.3 (*)    All other components within normal limits  URINALYSIS, MICROSCOPIC (REFLEX) - Abnormal; Notable for the following components:   Bacteria, UA MANY (*)    All other components within normal limits  CBG MONITORING, ED - Abnormal; Notable for the following components:   Glucose-Capillary 117 (*)    All other components within normal limits    EKG EKG Interpretation  Date/Time:  Tuesday June 18 2018 13:23:08 EST Ventricular Rate:  73 PR Interval:    QRS Duration: 84 QT Interval:  383 QTC Calculation: 422 R Axis:   50 Text Interpretation:  Sinus rhythm Low voltage, precordial leads when compared to prior, t wave inversion new comapred to 2008 but similar to 2005.  No STEMI Confirmed by Theda Belfastegeler, Chris ([id number]) on 06/18/2018 1:45:23 PM   Radiology No results found.  Procedures Procedures (including critical care time)  Medications Ordered in ED Medications  sodium chloride 0.9 % bolus 1,000 mL (0 mLs Intravenous Stopped 06/18/18 1508)  potassium chloride SA (K-DUR,KLOR-CON) CR tablet 40 mEq (40 mEq Oral Given 06/18/18 1353)  sodium chloride 0.9 % bolus 1,000 mL (0 mLs Intravenous Stopped 06/18/18 1508)     Initial Impression / Assessment and Plan / ED Course  I have reviewed the triage vital signs and the nursing notes.  Pertinent labs & imaging results that were available during my care of the patient were reviewed by me and considered in my medical decision making (see chart for details).  Pt reports her coworker did not see her hit her head when she passed out. Her  exam is atraumatic and normocephalic without tenderness to palpation, wounds, ecchymosis, or lacerations. Her neuro exam was normal without focal deficits. At this time I do not feel it is necessary to scan her head and engaged in shared decision making with the patient, she agrees.  BMP shows hypokalemia with potassium of 3.1. She was given p.o. potassium in the ED for replenishment of her hypokalemia. CBC shows hemoglobin is 10.3 today, compared to her history this is slightly decreased. This could be normal in the setting of her pregnancy. No active bleeding noted. Her UA shows protein in urine, but she is not hypertensive making preeclampsia less likely at this time. No signs of preeclampsia at this time. Recommended to have it rechecked at her OB follow up.  Pt reports her blood pressure is normally around 100/60. Pt is not symptomatic after 2L of fluids and her blood pressure is around her baseline. She ambulated to the bathroom multiple times without dizziness.  Discussed strict ED return precautions. Pt verbalized understanding of and is in agreement with this plan. Pt stable for discharge home at this time.  The patient was discussed by Dr. Rush Landmarkegeler who agrees with the treatment plan.    06/20/18 12:09 AM  After reviewing pt's labs her UA is suggestive of UTI. She denied urinary symptoms during my exam, however her UTI still needs to be treated. She has had multiple during this pregnancy and finished antibiotics x2 weeks ago. I called in Macrobid 100mg  PO BI x 7 days to her pharmacy earlier today at 1300. I called the pt to let her know, but she did not answer. I left a message telling her she has a prescription called in. I will attempt to call her again tomorrow.     Final Clinical Impressions(s) / ED Diagnoses   Final diagnoses:  Syncope, unspecified syncope type    ED Discharge Orders    None       Kathyrn Lasslbrizze, Kaitlyn E, PA-C 06/18/18 2323    Tegeler, Canary Brimhristopher J,  MD 06/19/18 0710    Sherene SiresAlbrizze, Kaitlyn E, PA-C 06/20/18 0011    Tegeler, Canary Brimhristopher J, MD 06/20/18 [phone]

## 2018-06-18 NOTE — ED Triage Notes (Signed)
She passed out at work today. Abrasions to her back.  She is [redacted] weeks pregnant. Pale. EMS came to the scene. Her CBG was 95, her BP was 90/60. She is ambulatory, alert oriented, pale.

## 2018-06-18 NOTE — ED Notes (Signed)
Pt on monitor 

## 2018-06-18 NOTE — ED Notes (Signed)
Pt ambulatory to [facility]- reports feeling better. Denies dizziness, denies weakness.

## 2018-06-18 NOTE — Discharge Instructions (Addendum)
Follow up with your OB doctor in the next 2-5 days for further evaluation. Your lab work today and EKG did not show an obvious cause of your passing out today. It is important to stay hydrated and eat on a regular schedule to make sure your body can perform at it's best.   Return to the emergency department for worsening symptoms to include chest pain, weakness, irregular heartbeat, shortness of breath, abdominal pain, vaginal discharge, vaginal bleeding.

## 2018-07-03 DIAGNOSIS — Z3480 Encounter for supervision of other normal pregnancy, unspecified trimester: Secondary | ICD-10-CM | POA: Diagnosis not present

## 2018-07-16 DIAGNOSIS — Z23 Encounter for immunization: Secondary | ICD-10-CM | POA: Diagnosis not present

## 2018-07-16 DIAGNOSIS — R87612 Low grade squamous intraepithelial lesion on cytologic smear of cervix (LGSIL): Secondary | ICD-10-CM | POA: Diagnosis not present

## 2018-09-05 DIAGNOSIS — Z3480 Encounter for supervision of other normal pregnancy, unspecified trimester: Secondary | ICD-10-CM | POA: Diagnosis not present

## 2018-09-25 ENCOUNTER — Inpatient Hospital Stay (EMERGENCY_DEPARTMENT_HOSPITAL)
Admission: EM | Admit: 2018-09-25 | Discharge: 2018-09-26 | Disposition: A | Discharge status: Home / Self Care | Payer: Medicaid Other | Source: Home / Self Care | Admitting: Obstetrics and Gynecology

## 2018-09-25 ENCOUNTER — Other Ambulatory Visit: Payer: Self-pay

## 2018-09-25 ENCOUNTER — Encounter (HOSPITAL_COMMUNITY): Payer: Self-pay

## 2018-09-25 DIAGNOSIS — O471 False labor at or after 37 completed weeks of gestation: Secondary | ICD-10-CM

## 2018-09-25 DIAGNOSIS — O26893 Other specified pregnancy related conditions, third trimester: Secondary | ICD-10-CM

## 2018-09-25 DIAGNOSIS — N859 Noninflammatory disorder of uterus, unspecified: Secondary | ICD-10-CM | POA: Insufficient documentation

## 2018-09-25 DIAGNOSIS — Z3A39 39 weeks gestation of pregnancy: Secondary | ICD-10-CM

## 2018-09-25 DIAGNOSIS — N858 Other specified noninflammatory disorders of uterus: Secondary | ICD-10-CM

## 2018-09-25 NOTE — MAU Note (Signed)
Contractions 4-6 minutes apart since 1930.  No LOF/VB.  + FM.  No complications w/ pregnancy.  Dilated 3/70% today in the office.

## 2018-09-26 ENCOUNTER — Other Ambulatory Visit: Payer: Self-pay

## 2018-09-26 ENCOUNTER — Encounter (HOSPITAL_COMMUNITY): Payer: Self-pay

## 2018-09-26 ENCOUNTER — Inpatient Hospital Stay (HOSPITAL_COMMUNITY)
Admission: AD | Admit: 2018-09-26 | Discharge: 2018-09-27 | DRG: 807 | Disposition: A | Discharge status: Home / Self Care | Payer: Medicaid Other | Attending: Obstetrics | Admitting: Obstetrics

## 2018-09-26 DIAGNOSIS — O9989 Other specified diseases and conditions complicating pregnancy, childbirth and the puerperium: Secondary | ICD-10-CM

## 2018-09-26 DIAGNOSIS — O26893 Other specified pregnancy related conditions, third trimester: Secondary | ICD-10-CM | POA: Diagnosis present

## 2018-09-26 DIAGNOSIS — Z3A39 39 weeks gestation of pregnancy: Secondary | ICD-10-CM

## 2018-09-26 DIAGNOSIS — N859 Noninflammatory disorder of uterus, unspecified: Secondary | ICD-10-CM

## 2018-09-26 LAB — CBC
HCT: 31.6 % — ABNORMAL LOW (ref 36.0–46.0)
Hemoglobin: 10.2 g/dL — ABNORMAL LOW (ref 12.0–15.0)
MCH: 26.9 pg (ref 26.0–34.0)
MCHC: 32.3 g/dL (ref 30.0–36.0)
MCV: 83.4 fL (ref 80.0–100.0)
Platelets: 234 10*3/uL (ref 150–400)
RBC: 3.79 MIL/uL — ABNORMAL LOW (ref 3.87–5.11)
RDW: 13.2 % (ref 11.5–15.5)
WBC: 12 10*3/uL — ABNORMAL HIGH (ref 4.0–10.5)
nRBC: 0 % (ref 0.0–0.2)

## 2018-09-26 LAB — TYPE AND SCREEN
ABO/RH(D): A POS
Antibody Screen: NEGATIVE

## 2018-09-26 LAB — RPR: RPR Ser Ql: NONREACTIVE

## 2018-09-26 MED ORDER — ACETAMINOPHEN 325 MG PO TABS: 650.0000 mg | ORAL_TABLET | ORAL | Status: DC | PRN

## 2018-09-26 MED ORDER — DIPHENHYDRAMINE HCL 25 MG PO CAPS: 25.0000 mg | ORAL_CAPSULE | Freq: Four times a day (QID) | ORAL | Status: DC | PRN

## 2018-09-26 MED ORDER — DIBUCAINE (PERIANAL) 1 % EX OINT: 1.0000 "application " | TOPICAL_OINTMENT | CUTANEOUS | Status: DC | PRN

## 2018-09-26 MED ORDER — OXYTOCIN 40 UNITS IN NORMAL SALINE INFUSION - SIMPLE MED
2.5000 [IU]/h | INTRAVENOUS | Status: DC
  Filled 2018-09-26: qty 1000

## 2018-09-26 MED ORDER — COCONUT OIL OIL: 1.0000 "application " | TOPICAL_OIL | Status: DC | PRN

## 2018-09-26 MED ORDER — ONDANSETRON HCL 4 MG/2ML IJ SOLN: 4.0000 mg | Freq: Four times a day (QID) | INTRAMUSCULAR | Status: DC | PRN

## 2018-09-26 MED ORDER — DIPHENHYDRAMINE HCL 50 MG/ML IJ SOLN: 12.5000 mg | INTRAMUSCULAR | Status: DC | PRN

## 2018-09-26 MED ORDER — PHENYLEPHRINE 40 MCG/ML (10ML) SYRINGE FOR IV PUSH (FOR BLOOD PRESSURE SUPPORT): 80.0000 ug | PREFILLED_SYRINGE | INTRAVENOUS | Status: DC | PRN

## 2018-09-26 MED ORDER — ZOLPIDEM TARTRATE 5 MG PO TABS: 5.0000 mg | ORAL_TABLET | Freq: Every evening | ORAL | Status: DC | PRN

## 2018-09-26 MED ORDER — LACTATED RINGERS IV SOLN
INTRAVENOUS | Status: DC
  Administered 2018-09-26: 06:00:00 via INTRAVENOUS

## 2018-09-26 MED ORDER — IBUPROFEN 600 MG PO TABS
600.0000 mg | ORAL_TABLET | Freq: Four times a day (QID) | ORAL | Status: DC
  Administered 2018-09-26 – 2018-09-27 (×5): 600 mg via ORAL
  Filled 2018-09-26 (×5): qty 1

## 2018-09-26 MED ORDER — WITCH HAZEL-GLYCERIN EX PADS: 1.0000 "application " | MEDICATED_PAD | CUTANEOUS | Status: DC | PRN

## 2018-09-26 MED ORDER — ONDANSETRON HCL 4 MG PO TABS: 4.0000 mg | ORAL_TABLET | ORAL | Status: DC | PRN

## 2018-09-26 MED ORDER — LACTATED RINGERS IV SOLN: 500.0000 mL | INTRAVENOUS | Status: DC | PRN

## 2018-09-26 MED ORDER — LIDOCAINE HCL (PF) 1 % IJ SOLN: 30.0000 mL | INTRAMUSCULAR | Status: DC | PRN

## 2018-09-26 MED ORDER — TETANUS-DIPHTH-ACELL PERTUSSIS 5-2.5-18.5 LF-MCG/0.5 IM SUSP: 0.5000 mL | Freq: Once | INTRAMUSCULAR | Status: DC

## 2018-09-26 MED ORDER — FLEET ENEMA 7-19 GM/118ML RE ENEM: 1.0000 | ENEMA | RECTAL | Status: DC | PRN

## 2018-09-26 MED ORDER — OXYCODONE-ACETAMINOPHEN 5-325 MG PO TABS: 1.0000 | ORAL_TABLET | ORAL | Status: DC | PRN

## 2018-09-26 MED ORDER — ACETAMINOPHEN 325 MG PO TABS
650.0000 mg | ORAL_TABLET | ORAL | Status: DC | PRN
  Administered 2018-09-26: 09:00:00 650 mg via ORAL
  Filled 2018-09-26: qty 2

## 2018-09-26 MED ORDER — BENZOCAINE-MENTHOL 20-0.5 % EX AERO: 1.0000 "application " | INHALATION_SPRAY | CUTANEOUS | Status: DC | PRN

## 2018-09-26 MED ORDER — EPHEDRINE 5 MG/ML INJ: 10.0000 mg | INTRAVENOUS | Status: DC | PRN

## 2018-09-26 MED ORDER — SOD CITRATE-CITRIC ACID 500-334 MG/5ML PO SOLN: 30.0000 mL | ORAL | Status: DC | PRN

## 2018-09-26 MED ORDER — OXYTOCIN BOLUS FROM INFUSION
500.0000 mL | Freq: Once | INTRAVENOUS | Status: AC
  Administered 2018-09-26: 06:00:00 500 mL via INTRAVENOUS

## 2018-09-26 MED ORDER — OXYCODONE-ACETAMINOPHEN 5-325 MG PO TABS
2.0000 | ORAL_TABLET | ORAL | Status: DC | PRN
  Administered 2018-09-26: 2 via ORAL
  Filled 2018-09-26: qty 2

## 2018-09-26 MED ORDER — FENTANYL-BUPIVACAINE-NACL 0.5-0.125-0.9 MG/250ML-% EP SOLN
EPIDURAL | Status: AC
  Filled 2018-09-26: qty 250

## 2018-09-26 MED ORDER — OXYCODONE-ACETAMINOPHEN 5-325 MG PO TABS: 2.0000 | ORAL_TABLET | ORAL | Status: DC | PRN

## 2018-09-26 MED ORDER — LACTATED RINGERS IV SOLN: 500.0000 mL | Freq: Once | INTRAVENOUS | Status: DC

## 2018-09-26 MED ORDER — ONDANSETRON HCL 4 MG/2ML IJ SOLN: 4.0000 mg | INTRAMUSCULAR | Status: DC | PRN

## 2018-09-26 MED ORDER — PRENATAL MULTIVITAMIN CH
1.0000 | ORAL_TABLET | Freq: Every day | ORAL | Status: DC
  Administered 2018-09-26 – 2018-09-27 (×2): 1 via ORAL
  Filled 2018-09-26 (×2): qty 1

## 2018-09-26 MED ORDER — FENTANYL-BUPIVACAINE-NACL 0.5-0.125-0.9 MG/250ML-% EP SOLN: 12.0000 mL/h | EPIDURAL | Status: DC | PRN

## 2018-09-26 MED ORDER — OXYCODONE-ACETAMINOPHEN 5-325 MG PO TABS
1.0000 | ORAL_TABLET | ORAL | Status: DC | PRN
  Administered 2018-09-26 – 2018-09-27 (×3): 1 via ORAL
  Filled 2018-09-26 (×3): qty 1

## 2018-09-26 MED ORDER — SENNOSIDES-DOCUSATE SODIUM 8.6-50 MG PO TABS
2.0000 | ORAL_TABLET | ORAL | Status: DC
  Administered 2018-09-26: 23:00:00 2 via ORAL
  Filled 2018-09-26: qty 2

## 2018-09-26 MED ORDER — SIMETHICONE 80 MG PO CHEW: 80.0000 mg | CHEWABLE_TABLET | ORAL | Status: DC | PRN

## 2018-09-26 NOTE — MAU Note (Signed)
I have communicated with Wynelle Bourgeois, CNM and reviewed vital signs:  Vitals:   09/25/18 2237 09/26/18 0019  BP: 122/75 (!) 100/57  Pulse: 74 71  Resp: 19   Temp: 98.5 F (36.9 C)   SpO2: 98%     Vaginal exam:  Dilation: 3 Effacement (%): 70 Cervical Position: Posterior Station: -3 Presentation: Vertex Exam by:: Zenia Resides, RN ,   Also reviewed contraction pattern and that non-stress test is reactive.  It has been documented that patient is contracting every 5-7 minutes with no cervical change over 1 hour not indicating active labor.  Patient denies any other complaints.  Based on this report provider has given order for discharge.  A discharge order and diagnosis entered by a provider.   Labor discharge instructions reviewed with patient.

## 2018-09-26 NOTE — Discharge Instructions (Signed)

## 2018-09-26 NOTE — MAU Note (Signed)
Contractions have gotten stronger and now feel back to back.  No LOF.  Some spotting.  Was 3 cm earlier tonight.

## 2018-09-26 NOTE — MAU Provider Note (Signed)
Chief Complaint:  Contractions    HPI: Teresa GeneralStacie Lucas is a 28 y.o. [id number] at 6439w5dwho presents to maternity admissions reporting painful uterine contractions.  I was asked to put in her discharge orders.  Has been checked and has not changed her cervix.    Past Medical History: Past Medical History:  Diagnosis Date  . Miscarriage     Past obstetric history: OB History  Gravida Para Term Preterm AB Living  6 1 1   4 1   SAB TAB Ectopic Multiple Live Births  4       1    # Outcome Date GA Lbr Len/2nd Weight Sex Delivery Anes PTL Lv  6 Current           5 SAB 2019          4 Term 01/18/16 6675w3d    Vag-Spont   LIV  3 SAB 2016          2 SAB 2016          1 SAB 2016            Past Surgical History: Past Surgical History:  Procedure Laterality Date  . DILATION AND CURETTAGE OF UTERUS      Family History: Family History  Problem Relation Age of Onset  . COPD Father     Social History: Social History   Tobacco Use  . Smoking status: Never Smoker  . Smokeless tobacco: Never Used  Substance Use Topics  . Alcohol use: Not Currently    Comment: socially  . Drug use: Never    Allergies:  Allergies  Allergen Reactions  . Shellfish Allergy Nausea And Vomiting    Meds:  Medications Prior to Admission  Medication Sig Dispense Refill Last Dose  . nitrofurantoin, macrocrystal-monohydrate, (MACROBID) 100 MG capsule Take 100 mg by mouth 2 (two) times daily.   09/24/2018 at Unknown time  . Prenatal Multivit-Min-Fe-FA (PRENATAL VITAMINS PO) Take by mouth.   09/25/2018 at Unknown time    I have reviewed patient's Past Medical Hx, Surgical Hx, Family Hx, Social Hx, medications and allergies.   ROS:  Review of Systems Other systems negative  Physical Exam   Patient Vitals for the past 24 hrs:  BP Temp Pulse Resp SpO2 Height Weight  09/26/18 0019 (!) 100/57 - 71 - - - -  09/25/18 2237 122/75 98.5 F (36.9 C) 74 19 98 % 5\' 2"  (1.575 m) 64.6 kg   Constitutional:  Well-developed, well-nourished female in no acute distress.  Cardiovascular: normal rate and rhythm Respiratory: normal effort, clear to auscultation bilaterally GI: Abd soft, non-tender, gravid appropriate for gestational age.   No rebound or guarding. MS: Extremities nontender, no edema, normal ROM Neurologic: Alert and oriented x 4.  GU: Neg CVAT.  PELVIC EXAM: Cervix pink, visually closed, without lesion, scant white creamy discharge, vaginal walls and external genitalia normal Bimanual exam: Cervix firm, posterior, neg CMT, uterus nontender, Fundal Height consistent with dates, adnexa without tenderness, enlargement, or mass  Cervix Exam at 2251 Dilation: 3 Effacement (%): 70 Cervical Position: Posterior Station: -3 Presentation: Vertex Exam by:: Zenia ResidesNikki Risheq, RN   Cervix Exam at 0008 Dilation : 3 Effacement: 70% Station:  -3 Exam by:  Zenia ResidesNikki Risheq RN  FHT:  Baseline 140 , moderate variability, accelerations present, no decelerations Contractions: q 5-7 mins Irregular    Labs: No results found for this or any previous visit (from the past 24 hour(s)).    Imaging:  No results found.  MAU Course/MDM: NST reviewed and found to be reactive, Category I Treatments in MAU included EFM.    Assessment: 1. Uterine irritability     Plan: Discharge home Labor precautions and fetal kick counts Follow up in Office for prenatal visits and recheck of status Encouraged to return here or to other Urgent Care/ED if she develops worsening of symptoms, increase in pain, fever, or other concerning symptoms.   Follow-up Information    Ob/Gyn, [facility]. Schedule an appointment as soon as possible for a visit.   Contact information: [address] [place] [address] [phone]           Pt stable at time of discharge.  Wynelle Bourgeois CNM, MSN Certified Nurse-Midwife 09/26/2018 5:36 AM

## 2018-09-26 NOTE — H&P (Signed)
28 y.o. 593w5d  [id number] comes in c/o ctx.  Otherwise has good fetal movement and no bleeding.  She was seen overnight for contractions and remained essentially unchanged at 3cm so MAu staff sent her home.  Contractions become stronger and pt returned to MAU and was found to be 6-7cm.  She progressed rapidly to complete.  Past Medical History:  Diagnosis Date  . Miscarriage     Past Surgical History:  Procedure Laterality Date  . DILATION AND CURETTAGE OF UTERUS      OB History  Gravida Para Term Preterm AB Living  6 1 1   4 1   SAB TAB Ectopic Multiple Live Births  4       1    # Outcome Date GA Lbr Len/2nd Weight Sex Delivery Anes PTL Lv  6 Current           5 SAB 2019          4 Term 01/18/16 3894w3d    Vag-Spont   LIV  3 SAB 2016          2 SAB 2016          1 SAB 2016            Social History   Socioeconomic History  . Marital status: Single    Spouse name: Not on file  . Number of children: Not on file  . Years of education: Not on file  . Highest education level: Not on file  Occupational History  . Not on file  Social Needs  . Financial resource strain: Not on file  . Food insecurity:    Worry: Not on file    Inability: Not on file  . Transportation needs:    Medical: Not on file    Non-medical: Not on file  Tobacco Use  . Smoking status: Never Smoker  . Smokeless tobacco: Never Used  Substance and Sexual Activity  . Alcohol use: Not Currently    Comment: socially  . Drug use: Never  . Sexual activity: Not Currently  Lifestyle  . Physical activity:    Days per week: Not on file    Minutes per session: Not on file  . Stress: Not on file  Relationships  . Social connections:    Talks on phone: Not on file    Gets together: Not on file    Attends religious service: Not on file    Active member of club or organization: Not on file    Attends meetings of clubs or organizations: Not on file    Relationship status: Not on file  . Intimate partner violence:     Fear of current or ex partner: Not on file    Emotionally abused: Not on file    Physically abused: Not on file    Forced sexual activity: Not on file  Other Topics Concern  . Not on file  Social History Narrative  . Not on file   Shellfish allergy    Prenatal Transfer Tool  Maternal Diabetes: No Genetic Screening: Abnormal:  Results: Other:  NIPT x 2 no result Maternal Ultrasounds/Referrals: Normal Fetal Ultrasounds or other Referrals:  Referred to Materal Fetal Medicine  Maternal Substance Abuse:  No Significant Maternal Medications:  None Significant Maternal Lab Results: Lab values include: Group B Strep negative  Other PNC: uncomplicated.    Vitals:   09/26/18 0519  BP: 119/76  Pulse: 68  Resp: 18  Temp: (!) 96.7 F (35.9 C)  TempSrc: Axillary  SpO2: 98%    Lungs/Cor:  NAD Abdomen:  soft, gravid Ex:  no cords, erythema SVE/SSE/TOCO: pt now delivered   A/P   Admitted in labor at term  GBS Neg  Pt desired epidural but delivered prior to receiving  Philip Aspen

## 2018-09-27 LAB — CBC
HCT: 28.6 % — ABNORMAL LOW (ref 36.0–46.0)
Hemoglobin: 9.2 g/dL — ABNORMAL LOW (ref 12.0–15.0)
MCH: 26.8 pg (ref 26.0–34.0)
MCHC: 32.2 g/dL (ref 30.0–36.0)
MCV: 83.4 fL (ref 80.0–100.0)
Platelets: 200 10*3/uL (ref 150–400)
RBC: 3.43 MIL/uL — ABNORMAL LOW (ref 3.87–5.11)
RDW: 13.3 % (ref 11.5–15.5)
WBC: 11.1 10*3/uL — ABNORMAL HIGH (ref 4.0–10.5)
nRBC: 0 % (ref 0.0–0.2)

## 2018-09-27 LAB — ABO/RH: ABO/RH(D): A POS

## 2018-09-27 MED ORDER — IBUPROFEN 600 MG PO TABS: 600.0000 mg | ORAL_TABLET | Freq: Four times a day (QID) | ORAL | 0 refills | Status: DC | PRN

## 2018-09-27 NOTE — Discharge Summary (Signed)
Obstetric Discharge Summary Reason for Admission: onset of labor Prenatal Procedures: ultrasound Intrapartum Procedures: spontaneous vaginal delivery Postpartum Procedures: none Complications-Operative and Postpartum: none Hemoglobin  Date Value Ref Range Status  09/27/2018 9.2 (L) 12.0 - 15.0 g/dL Final   HCT  Date Value Ref Range Status  09/27/2018 28.6 (L) 36.0 - 46.0 % Final    Physical Exam:  General: alert, cooperative and appears stated age 28: appropriate Uterine Fundus: firm DVT Evaluation: No evidence of DVT seen on physical exam.  Discharge Diagnoses: Term Pregnancy-delivered  Discharge Information: Date: 09/27/2018 Activity: pelvic rest Diet: routine Medications: Ibuprofen Condition: improved Instructions: refer to practice specific booklet Discharge to: home Follow-up Information    Philip Aspen, DO Follow up in 4 week(s).   Specialty:  Obstetrics and Gynecology Why:  For a post partum appointment Contact information: [address] [place] [address] [phone]           Newborn Data: Live born female  Birth Weight: 6 lb 11.8 oz (3055 g) APGAR: 8, 9  Newborn Delivery   Birth date/time:  09/26/2018 06:06:00 Delivery type:  Vaginal, Spontaneous     Home with mother.  Waynard Reeds 09/27/2018, 11:40 AM

## 2018-09-27 NOTE — Discharge Instructions (Signed)
Breast Pumping Tips Breast pumping is a way to get milk out of your breasts. You will then store the milk for your baby to use when you are away from home. There are three ways to pump. You can:  Use your hand to massage and squeeze your breast (hand expression).  Use a hand-held machine to manually pump your milk.  Use an electric machine to pump your milk. In the beginning you may not get much milk. After a few days your breasts should make more. Pumping can help you start making milk after your baby is born. Pumping helps you to keep making milk when you are away from your baby. When should I pump? You can start pumping soon after your baby is born. Follow these tips:  When you are with your baby: ? Pump after you breastfeed. ? Pump from the free breast while you breastfeed.  When you are away from your baby: ? Pump every 2-3 hours for 15 minutes. ? Pump both breasts at the same time if you can.  If your baby drinks formula, pump around the time your baby gets the formula.  If you drank alcohol, wait 2 hours before you pump.  If you are going to have surgery, ask your doctor when you should pump again. How do I get ready to pump? Take steps to relax. Try these things to help your milk come in:  Smell your baby's blanket or clothes.  Look at a picture or video of your baby.  Sit in a quiet, private space.  Massage your breast and nipple.  Place a cloth on your breast. The cloth should be warm and a little wet.  Play relaxing music.  Picture your milk flowing. What are some tips? General tips for pumping breast milk  Always wash your hands before pumping.  If you do not get much milk or if pumping hurts, try different pump settings or a different kind of pump.  Drink enough fluid so your pee (urine) is clear or pale yellow.  Wear clothing that opens in the front or is easy to take off.  Pump milk into a clean bottle or container.  Do not use anything that has  nicotine or tobacco. Examples are cigarettes and e-cigarettes. If you need help quitting, ask your doctor. Tips for storing breast milk  Store breast milk in a clean, BPA-free container. These include: ? A glass or plastic bottle. ? A milk storage bag.  Store only 2-4 ounces of breast milk in each container.  Swirl the breast milk in the container. Do not shake it.  Write down the date you pumped the milk on the container.  This is how long you can store breast milk: ? Room temperature: 6-8 hours. It is best to use the milk within 4 hours. ? Cooler with ice packs: 24 hours. ? Refrigerator: 5-8 days, if the milk is clean. It is best to use the milk within 3 days. ? Freezer: 9-12 months, if the milk is clean and stored away from the freezer door. It is best to use the milk within 6 months.  Put milk in the back of the refrigerator or freezer.  Thaw frozen milk using warm water. Do not use the microwave. Tips for choosing a breast pump When choosing a pump, keep the following things in mind:  Manual breast pumps do not need electricity. They cost less. They can be hard to use.  Electric breast pumps use electricity. They are more  expensive. They are easier to use. They collect more milk.  The suction cup (flange) should be the right size.  Before you buy the pump, check if your insurance will pay for it. Tips for caring for a breast pump  Check the manual that came with your pump for cleaning tips.  Clean the pump after you use it. To do this: 1. Wipe down the electrical part. Use a dry cloth or paper towel. Do not put this part in water or in cleaning products. 2. Wash the plastic parts with soap and warm water. Or use the dishwasher if the manual says it is safe. You do not need to clean the tubing unless it touched breast milk. 3. Let all the parts air dry. Avoid drying them with a cloth or towel. 4. When the parts are clean and dry, put the pump back together. Then store the  pump.  If there is water in the tubing when you want to pump: 1. Attach the tubing to the pump. 2. Turn on the pump. 3. Turn off the pump when the tube is dry.  Try not to touch the inside of pump parts. Summary  Pumping can help you start making milk after your baby is born. It lets you keep making milk when you are away from your baby.  When you are away from your baby, pump for about 15 minutes every 2-3 hours. Pump both breasts at the same time, if you can. This information is not intended to replace advice given to you by your health care provider. Make sure you discuss any questions you have with your health care provider. Document Released: 11/08/2007 Document Revised: 06/26/2016 Document Reviewed: 06/26/2016 Elsevier Interactive Patient Education  2019 [employer].   Breastfeeding  Choosing to breastfeed is one of the best decisions you can make for yourself and your baby. A change in hormones during pregnancy causes your breasts to make breast milk in your milk-producing glands. Hormones prevent breast milk from being released before your baby is born. They also prompt milk flow after birth. Once breastfeeding has begun, thoughts of your baby, as well as his or her sucking or crying, can stimulate the release of milk from your milk-producing glands. Benefits of breastfeeding Research shows that breastfeeding offers many health benefits for infants and mothers. It also offers a cost-free and convenient way to feed your baby. For your baby  Your first milk (colostrum) helps your baby's digestive system to function better.  Special cells in your milk (antibodies) help your baby to fight off infections.  Breastfed babies are less likely to develop asthma, allergies, obesity, or type 2 diabetes. They are also at lower risk for sudden infant death syndrome (SIDS).  Nutrients in breast milk are better able to meet your babys needs compared to infant formula.  Breast milk  improves your baby's brain development. For you  Breastfeeding helps to create a very special bond between you and your baby.  Breastfeeding is convenient. Breast milk costs nothing and is always available at the correct temperature.  Breastfeeding helps to burn calories. It helps you to lose the weight that you gained during pregnancy.  Breastfeeding makes your uterus return faster to its size before pregnancy. It also slows bleeding (lochia) after you give birth.  Breastfeeding helps to lower your risk of developing type 2 diabetes, osteoporosis, rheumatoid arthritis, cardiovascular disease, and breast, ovarian, uterine, and endometrial cancer later in life. Breastfeeding basics Starting breastfeeding  Find a comfortable place  to sit or lie down, with your neck and back well-supported.  Place a pillow or a rolled-up blanket under your baby to bring him or her to the level of your breast (if you are seated). Nursing pillows are specially designed to help support your arms and your baby while you breastfeed.  Make sure that your baby's tummy (abdomen) is facing your abdomen.  Gently massage your breast. With your fingertips, massage from the outer edges of your breast inward toward the nipple. This encourages milk flow. If your milk flows slowly, you may need to continue this action during the feeding.  Support your breast with 4 fingers underneath and your thumb above your nipple (make the letter "C" with your hand). Make sure your fingers are well away from your nipple and your babys mouth.  Stroke your baby's lips gently with your finger or nipple.  When your baby's mouth is open wide enough, quickly bring your baby to your breast, placing your entire nipple and as much of the areola as possible into your baby's mouth. The areola is the colored area around your nipple. ? More areola should be visible above your baby's upper lip than below the lower lip. ? Your baby's lips should be  opened and extended outward (flanged) to ensure an adequate, comfortable latch. ? Your baby's tongue should be between his or her lower gum and your breast.  Make sure that your baby's mouth is correctly positioned around your nipple (latched). Your baby's lips should create a seal on your breast and be turned out (everted).  It is common for your baby to suck about 2-3 minutes in order to start the flow of breast milk. Latching Teaching your baby how to latch onto your breast properly is very important. An improper latch can cause nipple pain, decreased milk supply, and poor weight gain in your baby. Also, if your baby is not latched onto your nipple properly, he or she may swallow some air during feeding. This can make your baby fussy. Burping your baby when you switch breasts during the feeding can help to get rid of the air. However, teaching your baby to latch on properly is still the best way to prevent fussiness from swallowing air while breastfeeding. Signs that your baby has successfully latched onto your nipple  Silent tugging or silent sucking, without causing you pain. Infant's lips should be extended outward (flanged).  Swallowing heard between every 3-4 sucks once your milk has started to flow (after your let-down milk reflex occurs).  Muscle movement above and in front of his or her ears while sucking. Signs that your baby has not successfully latched onto your nipple  Sucking sounds or smacking sounds from your baby while breastfeeding.  Nipple pain. If you think your baby has not latched on correctly, slip your finger into the corner of your babys mouth to break the suction and place it between your baby's gums. Attempt to start breastfeeding again. Signs of successful breastfeeding Signs from your baby  Your baby will gradually decrease the number of sucks or will completely stop sucking.  Your baby will fall asleep.  Your baby's body will relax.  Your baby will  retain a small amount of milk in his or her mouth.  Your baby will let go of your breast by himself or herself. Signs from you  Breasts that have increased in firmness, weight, and size 1-3 hours after feeding.  Breasts that are softer immediately after breastfeeding.  Increased milk  volume, as well as a change in milk consistency and color by the fifth day of breastfeeding.  Nipples that are not sore, cracked, or bleeding. Signs that your baby is getting enough milk  Wetting at least 1-2 diapers during the first 24 hours after birth.  Wetting at least 5-6 diapers every 24 hours for the first week after birth. The urine should be clear or pale yellow by the age of 5 days.  Wetting 6-8 diapers every 24 hours as your baby continues to grow and develop.  At least 3 stools in a 24-hour period by the age of 5 days. The stool should be soft and yellow.  At least 3 stools in a 24-hour period by the age of 7 days. The stool should be seedy and yellow.  No loss of weight greater than 10% of birth weight during the first 3 days of life.  Average weight gain of 4-7 oz (113-198 g) per week after the age of 4 days.  Consistent daily weight gain by the age of 5 days, without weight loss after the age of 2 weeks. After a feeding, your baby may spit up a small amount of milk. This is normal. Breastfeeding frequency and duration Frequent feeding will help you make more milk and can prevent sore nipples and extremely full breasts (breast engorgement). Breastfeed when you feel the need to reduce the fullness of your breasts or when your baby shows signs of hunger. This is called "breastfeeding on demand." Signs that your baby is hungry include:  Increased alertness, activity, or restlessness.  Movement of the head from side to side.  Opening of the mouth when the corner of the mouth or cheek is stroked (rooting).  Increased sucking sounds, smacking lips, cooing, sighing, or  squeaking.  Hand-to-mouth movements and sucking on fingers or hands.  Fussing or crying. Avoid introducing a pacifier to your baby in the first 4-6 weeks after your baby is born. After this time, you may choose to use a pacifier. Research has shown that pacifier use during the first year of a baby's life decreases the risk of sudden infant death syndrome (SIDS). Allow your baby to feed on each breast as long as he or she wants. When your baby unlatches or falls asleep while feeding from the first breast, offer the second breast. Because newborns are often sleepy in the first few weeks of life, you may need to awaken your baby to get him or her to feed. Breastfeeding times will vary from baby to baby. However, the following rules can serve as a guide to help you make sure that your baby is properly fed:  Newborns (babies 48 weeks of age or younger) may breastfeed every 1-3 hours.  Newborns should not go without breastfeeding for longer than 3 hours during the day or 5 hours during the night.  You should breastfeed your baby a minimum of 8 times in a 24-hour period. Breast milk pumping     Pumping and storing breast milk allows you to make sure that your baby is exclusively fed your breast milk, even at times when you are unable to breastfeed. This is especially important if you go back to work while you are still breastfeeding, or if you are not able to be present during feedings. Your lactation consultant can help you find a method of pumping that works best for you and give you guidelines about how long it is safe to store breast milk. Caring for your breasts while  you breastfeed Nipples can become dry, cracked, and sore while breastfeeding. The following recommendations can help keep your breasts moisturized and healthy:  Avoid using soap on your nipples.  Wear a supportive bra designed especially for nursing. Avoid wearing underwire-style bras or extremely tight bras (sports  bras).  Air-dry your nipples for 3-4 minutes after each feeding.  Use only cotton bra pads to absorb leaked breast milk. Leaking of breast milk between feedings is normal.  Use lanolin on your nipples after breastfeeding. Lanolin helps to maintain your skin's normal moisture barrier. Pure lanolin is not harmful (not toxic) to your baby. You may also hand express a few drops of breast milk and gently massage that milk into your nipples and allow the milk to air-dry. In the first few weeks after giving birth, some women experience breast engorgement. Engorgement can make your breasts feel heavy, warm, and tender to the touch. Engorgement peaks within 3-5 days after you give birth. The following recommendations can help to ease engorgement:  Completely empty your breasts while breastfeeding or pumping. You may want to start by applying warm, moist heat (in the shower or with warm, water-soaked hand towels) just before feeding or pumping. This increases circulation and helps the milk flow. If your baby does not completely empty your breasts while breastfeeding, pump any extra milk after he or she is finished.  Apply ice packs to your breasts immediately after breastfeeding or pumping, unless this is too uncomfortable for you. To do this: ? Put ice in a plastic bag. ? Place a towel between your skin and the bag. ? Leave the ice on for 20 minutes, 2-3 times a day.  Make sure that your baby is latched on and positioned properly while breastfeeding. If engorgement persists after 48 hours of following these recommendations, contact your health care provider or a Advertising copywriter. Overall health care recommendations while breastfeeding  Eat 3 healthy meals and 3 snacks every day. Well-nourished mothers who are breastfeeding need an additional 450-500 calories a day. You can meet this requirement by increasing the amount of a balanced diet that you eat.  Drink enough water to keep your urine pale  yellow or clear.  Rest often, relax, and continue to take your prenatal vitamins to prevent fatigue, stress, and low vitamin and mineral levels in your body (nutrient deficiencies).  Do not use any products that contain nicotine or tobacco, such as cigarettes and e-cigarettes. Your baby may be harmed by chemicals from cigarettes that pass into breast milk and exposure to secondhand smoke. If you need help quitting, ask your health care provider.  Avoid alcohol.  Do not use illegal drugs or marijuana.  Talk with your health care provider before taking any medicines. These include over-the-counter and prescription medicines as well as vitamins and herbal supplements. Some medicines that may be harmful to your baby can pass through breast milk.  It is possible to become pregnant while breastfeeding. If birth control is desired, ask your health care provider about options that will be safe while breastfeeding your baby. Where to find more information: [employer]: www.llli.org Contact a health care provider if:  You feel like you want to stop breastfeeding or have become frustrated with breastfeeding.  Your nipples are cracked or bleeding.  Your breasts are red, tender, or warm.  You have: ? Painful breasts or nipples. ? A swollen area on either breast. ? A fever or chills. ? Nausea or vomiting. ? Drainage other than breast  milk from your nipples.  Your breasts do not become full before feedings by the fifth day after you give birth.  You feel sad and depressed.  Your baby is: ? Too sleepy to eat well. ? Having trouble sleeping. ? More than 451 week old and wetting fewer than 6 diapers in a 24-hour period. ? Not gaining weight by 715 days of age.  Your baby has fewer than 3 stools in a 24-hour period.  Your baby's skin or the white parts of his or her eyes become yellow. Get help right away if:  Your baby is overly tired (lethargic) and does not want to wake up  and feed.  Your baby develops an unexplained fever. Summary  Breastfeeding offers many health benefits for infant and mothers.  Try to breastfeed your infant when he or she shows early signs of hunger.  Gently tickle or stroke your baby's lips with your finger or nipple to allow the baby to open his or her mouth. Bring the baby to your breast. Make sure that much of the areola is in your baby's mouth. Offer one side and burp the baby before you offer the other side.  Talk with your health care provider or lactation consultant if you have questions or you face problems as you breastfeed. This information is not intended to replace advice given to you by your health care provider. Make sure you discuss any questions you have with your health care provider. Document Released: 05/22/2005 Document Revised: 06/23/2016 Document Reviewed: 06/23/2016 Elsevier Interactive Patient Education  2019 [employer]

## 2018-09-27 NOTE — Progress Notes (Signed)
Post Partum Day 1 Subjective: no complaints, up ad lib, voiding and tolerating PO  Objective: Blood pressure 114/66, pulse (!) 59, temperature 98.4 F (36.9 C), temperature source Oral, resp. rate 17, SpO2 99 %, unknown if currently breastfeeding.  Physical Exam:  General: alert, cooperative and appears stated age Lochia: appropriate Uterine Fundus: firm DVT Evaluation: No evidence of DVT seen on physical exam.  Recent Labs    09/26/18 0530 09/27/18 0543  HGB 10.2* 9.2*  HCT 31.6* 28.6*    Assessment/Plan: Discharge home  Desires neonatal circumcision, R/B/A of procedure discussed at length. Pt understands that neonatal circumcision is not considered medically necessary and is elective. The risks include, but are not limited to bleeding, infection, damage to the penis, development of scar tissue, and having to have it redone at a later date. Pt understands theses risks and wishes to proceed    LOS: 1 day   Waynard Reeds 09/27/2018, 11:36 AM

## 2018-10-25 DIAGNOSIS — Z3202 Encounter for pregnancy test, result negative: Secondary | ICD-10-CM | POA: Diagnosis not present

## 2018-10-25 DIAGNOSIS — Z1331 Encounter for screening for depression: Secondary | ICD-10-CM | POA: Diagnosis not present

## 2018-11-08 DIAGNOSIS — Z30430 Encounter for insertion of intrauterine contraceptive device: Secondary | ICD-10-CM | POA: Diagnosis not present

## 2018-11-08 DIAGNOSIS — Z3202 Encounter for pregnancy test, result negative: Secondary | ICD-10-CM | POA: Diagnosis not present

## 2018-12-04 DIAGNOSIS — Z30431 Encounter for routine checking of intrauterine contraceptive device: Secondary | ICD-10-CM | POA: Diagnosis not present

## 2019-07-28 DIAGNOSIS — H1013 Acute atopic conjunctivitis, bilateral: Secondary | ICD-10-CM | POA: Diagnosis not present

## 2019-08-07 DIAGNOSIS — H1013 Acute atopic conjunctivitis, bilateral: Secondary | ICD-10-CM | POA: Diagnosis not present

## 2020-02-15 DIAGNOSIS — H5213 Myopia, bilateral: Secondary | ICD-10-CM | POA: Diagnosis not present

## 2021-09-22 DIAGNOSIS — J069 Acute upper respiratory infection, unspecified: Secondary | ICD-10-CM | POA: Diagnosis not present

## 2021-09-22 DIAGNOSIS — J029 Acute pharyngitis, unspecified: Secondary | ICD-10-CM | POA: Diagnosis not present

## 2021-09-22 DIAGNOSIS — R059 Cough, unspecified: Secondary | ICD-10-CM | POA: Diagnosis not present

## 2021-09-27 DIAGNOSIS — R102 Pelvic and perineal pain: Secondary | ICD-10-CM | POA: Diagnosis not present

## 2022-03-24 DIAGNOSIS — J069 Acute upper respiratory infection, unspecified: Secondary | ICD-10-CM | POA: Diagnosis not present

## 2022-03-24 DIAGNOSIS — R059 Cough, unspecified: Secondary | ICD-10-CM | POA: Diagnosis not present

## 2022-04-15 ENCOUNTER — Ambulatory Visit (INDEPENDENT_AMBULATORY_CARE_PROVIDER_SITE_OTHER): Payer: Medicaid Other

## 2022-04-15 ENCOUNTER — Ambulatory Visit
Admission: EM | Admit: 2022-04-15 | Discharge: 2022-04-15 | Disposition: A | Discharge status: Home / Self Care | Payer: Medicaid Other | Attending: Urgent Care | Admitting: Urgent Care

## 2022-04-15 DIAGNOSIS — N390 Urinary tract infection, site not specified: Secondary | ICD-10-CM | POA: Insufficient documentation

## 2022-04-15 DIAGNOSIS — M542 Cervicalgia: Secondary | ICD-10-CM | POA: Diagnosis not present

## 2022-04-15 DIAGNOSIS — M5412 Radiculopathy, cervical region: Secondary | ICD-10-CM

## 2022-04-15 MED ORDER — TIZANIDINE HCL 4 MG PO TABS: 4.0000 mg | ORAL_TABLET | Freq: Every day | ORAL | 0 refills | Status: DC

## 2022-04-15 MED ORDER — PREDNISONE 20 MG PO TABS: ORAL_TABLET | ORAL | 0 refills | Status: DC

## 2022-04-15 NOTE — ED Triage Notes (Signed)
Patient presents to UC for right shoulder pain x 2 months ago. She denies injury. States pain is now radiating to her arm.  Treating pain with ibuprofen.

## 2022-04-15 NOTE — ED Provider Notes (Signed)
[facility]  Note:  This document was prepared using Systems analyst and may include unintentional dictation errors.  MRN: [id number] DOB: [birth year]  Subjective:   Teresa Lucas is a 31 y.o. female presenting for 96-month history of persistent intermittent and worsening right-sided trapezius pain, shoulder pain now radiating to her entire arm.  Patient has been using ibuprofen.  She is also got a massage therapy.  Denies any particular fall, trauma.  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  Denies any history of neck injuries.   No current facility-administered medications for this encounter.  Current Outpatient Medications:    ibuprofen (ADVIL) 600 MG tablet, Take 1 tablet (600 mg total) by mouth every 6 (six) hours as needed., Disp: 90 tablet, Rfl: 0   Prenatal Multivit-Min-Fe-FA (PRENATAL VITAMINS PO), Take by mouth., Disp: , Rfl:    Allergies  Allergen Reactions   Shellfish Allergy Nausea And Vomiting    Past Medical History:  Diagnosis Date   Miscarriage      Past Surgical History:  Procedure Laterality Date   DILATION AND CURETTAGE OF UTERUS      Family History  Problem Relation Age of Onset   COPD Father     Social History   Tobacco Use   Smoking status: Never   Smokeless tobacco: Never  Vaping Use   Vaping Use: Never used  Substance Use Topics   Alcohol use: Yes    Comment: socially   Drug use: Never    ROS   Objective:   Vitals: BP 119/83 (BP Location: Right Arm)   Pulse 87   Temp 97.7 F (36.5 C) (Oral)   Resp 18   SpO2 99%   Breastfeeding No   Physical Exam Constitutional:      General: She is not in acute distress.    Appearance: Normal appearance. She is well-developed. She is not ill-appearing, toxic-appearing or diaphoretic.  HENT:     Head: Normocephalic and atraumatic.     Nose: Nose normal.     Mouth/Throat:     Mouth: Mucous membranes are moist.   Eyes:     General: No scleral icterus.       Right eye: No discharge.        Left eye: No discharge.     Extraocular Movements: Extraocular movements intact.  Cardiovascular:     Rate and Rhythm: Normal rate.  Pulmonary:     Effort: Pulmonary effort is normal.  Musculoskeletal:     Cervical back: Spasms (Diffuse over the paraspinal muscles of the cervical region extending into the trapezius) and tenderness (+ Spurling maneuver to the right) present. No swelling, edema, deformity, erythema, signs of trauma, lacerations, rigidity, torticollis, bony tenderness or crepitus. Pain with movement present. Decreased range of motion.  Skin:    General: Skin is warm and dry.  Neurological:     General: No focal deficit present.     Mental Status: She is alert and oriented to person, place, and time.  Psychiatric:        Mood and Affect: Mood normal.        Behavior: Behavior normal.    DG Cervical Spine Complete  Result Date: 04/15/2022 CLINICAL DATA:  Cervical radiculopathy. Right shoulder pain for 2 months. EXAM: CERVICAL SPINE - COMPLETE 4+ VIEW COMPARISON:  None Available. FINDINGS: No fracture, bone lesion or spondylolisthesis. Disc spaces are well preserved. Uncovertebral spurring noted bilaterally at C5-C6, on the  right causing mild neural foraminal narrowing. No other neural foraminal narrowing. Soft tissues are unremarkable. IMPRESSION: 1. Uncovertebral spurring bilaterally at C5-C6, greater on the right where there is mild right neural foraminal narrowing. No other abnormalities. Electronically Signed   By: Amie Portland M.D.   On: 04/15/2022 16:38     Assessment and Plan :   PDMP not reviewed this encounter.  1. Cervical radiculopathy     Given her cervical radiculopathy, positive Spurling maneuver recommended an oral prednisone course.  Discussed neck care.  I provided her with information to [facility].  Recommend that she follow-up with her  clinic given her symptoms and I do believe she would benefit from an MRI.  We will review her x-ray results as soon as possible. Counseled patient on potential for adverse effects with medications prescribed/recommended today, ER and return-to-clinic precautions discussed, patient verbalized understanding.    Wallis Bamberg, [place] 04/16/22 [phone]

## 2022-04-17 ENCOUNTER — Ambulatory Visit
Admission: EM | Admit: 2022-04-17 | Discharge: 2022-04-17 | Disposition: A | Discharge status: Home / Self Care | Payer: Medicaid Other | Attending: Emergency Medicine | Admitting: Emergency Medicine

## 2022-04-17 ENCOUNTER — Telehealth: Payer: Self-pay

## 2022-04-17 DIAGNOSIS — M5030 Other cervical disc degeneration, unspecified cervical region: Secondary | ICD-10-CM

## 2022-04-17 DIAGNOSIS — M4802 Spinal stenosis, cervical region: Secondary | ICD-10-CM

## 2022-04-17 DIAGNOSIS — M2578 Osteophyte, vertebrae: Secondary | ICD-10-CM

## 2022-04-17 MED ORDER — IBUPROFEN 600 MG PO TABS: 600.0000 mg | ORAL_TABLET | Freq: Three times a day (TID) | ORAL | 0 refills | Status: DC | PRN

## 2022-04-17 MED ORDER — BACLOFEN 10 MG PO TABS: 10.0000 mg | ORAL_TABLET | Freq: Three times a day (TID) | ORAL | 0 refills | Status: DC

## 2022-04-17 NOTE — ED Triage Notes (Addendum)
The patient was recently prescribed prednisone and states she has been having tachycardia, vomiting, and jitteriness this morning with prednisone.  The patient states she wants to know what medications she can take during the day for right sided arm pain.

## 2022-04-17 NOTE — Discharge Instructions (Addendum)
Your x-ray showed osteophytes and narrowing of the space where your spinal column passes through your cervical vertebra.  These types of degenerative changes are more often seen in athletes and cause symptoms such as numbness, tingling, weakness, decreased reflexes.  These changes can also be a early sign of herniated disc.  For this reason, it is important that you follow-up with orthopedics.  Please consider going to the [facility] to be seen more quickly, perhaps they can help you get in with a spine specialist.  In the meantime, please begin ibuprofen 600 mg every 8 hours and baclofen 10 mg 3 times daily for relief of pain and muscle spasm.  You have not taken gabapentin in the past and unfortunately it does not appear to be covered by your insurance will not prescribe it for you at this time.    I appreciate your returning to urgent care to further discuss this issue.  I sincerely hope these changes help with your pain.

## 2022-04-17 NOTE — Telephone Encounter (Signed)
Patient called to clinic stating when taking prednisone (prescribed on her last visit) she began to have nausea/ vomiting and tachycardia. The patient was made aware per the provider onsite she would have to be evaluated. Patients questions answered and she acknowledges understanding.

## 2022-04-17 NOTE — ED Provider Notes (Signed)
[facility]    CSN: [id number] Arrival date & time: 04/17/22  1857    HISTORY   Chief Complaint  Patient presents with   Arm Pain   HPI Teresa Lucas is a pleasant, 31 y.o. female who presents to urgent care today. Patient returns to urgent care after having been diagnosed with cervical radiculopathy and prescribed prednisone for treatment.  Patient states she has been experiencing tachycardia, vomiting and feeling over all jittery since she began taking it.  Patient states she would like to see what else she can take to relieve her symptoms.  Patient is left-handed, does not use her right hand to do very many things.  Patient denies history of playing sports, admits to having played church softball in the past but states she she did not pitch her back frequently.  Patient states she has 38-year-old child and does lift the child frequently.  Patient states she has had thoracic back pain on the left side in the past but has never had right shoulder pain similar to this.  Patient's neck x-ray findings were reviewed with her during her visit today.  The history is provided by the patient.   Past Medical History:  Diagnosis Date   Miscarriage    Patient Active Problem List   Diagnosis Date Noted   Recurrent urinary tract infection 04/15/2022   Pain in pelvis 09/27/2021   Normal labor 09/26/2018   SVD (spontaneous vaginal delivery) 01/19/2016   Pregnancy 01/17/2016   ASCUS with positive high risk HPV cervical 10/20/2015   Group B streptococcal bacteriuria 09/20/2015   SYNCOPE 08/19/2008   DYSPNEA 08/19/2008   Past Surgical History:  Procedure Laterality Date   DILATION AND CURETTAGE OF UTERUS     OB History     Gravida  6   Para  2   Term  2   Preterm      AB  4   Living  2      SAB  4   IAB      Ectopic      Multiple  0   Live Births  2          Home Medications    Prior to Admission medications   Medication Sig Start Date End Date  Taking? Authorizing Provider    Family History Family History  Problem Relation Age of Onset   COPD Father    Social History Social History   Tobacco Use   Smoking status: Never   Smokeless tobacco: Never  Vaping Use   Vaping Use: Never used  Substance Use Topics   Alcohol use: Yes    Comment: socially   Drug use: Never   Allergies   Prednisone and Shellfish allergy  Review of Systems Review of Systems Pertinent findings revealed after performing a 14 point review of systems has been noted in the history of present illness.  Physical Exam Triage Vital Signs ED Triage Vitals  Enc Vitals Group     BP 04/01/21 0827 (!) 147/82     Pulse Rate 04/01/21 0827 72     Resp 04/01/21 0827 18     Temp 04/01/21 0827 98.3 F (36.8 C)     Temp Source 04/01/21 0827 Oral     SpO2 04/01/21 0827 98 %     Weight --      Height --      Head Circumference --      Peak Flow --      Pain  Score 04/01/21 0826 5     Pain Loc --      Pain Edu? --      Excl. in [place]? --   No data found.  Updated Vital Signs BP 129/85 (BP Location: Left Arm)   Pulse 72   Temp 98.4 F (36.9 C) (Oral)   Resp 16   SpO2 98%   Physical Exam Constitutional:      General: She is not in acute distress.    Appearance: Normal appearance. She is well-developed. She is not ill-appearing, toxic-appearing or diaphoretic.  HENT:     Head: Normocephalic and atraumatic.     Nose: Nose normal.     Mouth/Throat:     Mouth: Mucous membranes are moist.  Eyes:     General: No scleral icterus.       Right eye: No discharge.        Left eye: No discharge.     Extraocular Movements: Extraocular movements intact.  Cardiovascular:     Rate and Rhythm: Normal rate.  Pulmonary:     Effort: Pulmonary effort is normal.  Musculoskeletal:     Cervical back: Spasms (Diffuse over the paraspinal muscles of the cervical region extending into the trapezius) and tenderness (+ Spurling maneuver to the right) present. No swelling,  edema, deformity, erythema, signs of trauma, lacerations, rigidity, torticollis, bony tenderness or crepitus. Pain with movement present. Decreased range of motion.  Skin:    General: Skin is warm and dry.  Neurological:     General: No focal deficit present.     Mental Status: She is alert and oriented to person, place, and time.  Psychiatric:        Mood and Affect: Mood normal.        Behavior: Behavior normal.     Visual Acuity Right Eye Distance:   Left Eye Distance:   Bilateral Distance:    Right Eye Near:   Left Eye Near:    Bilateral Near:     UC Couse / Diagnostics / Procedures:     Radiology No results found.  Procedures Procedures (including critical care time) EKG  Pending results:  Labs Reviewed - No data to display  Medications Ordered in UC: Medications - No data to display  UC Diagnoses / Final Clinical Impressions(s)   I have reviewed the triage vital signs and the nursing notes.  Pertinent labs & imaging results that were available during my care of the patient were reviewed by me and considered in my medical decision making (see chart for details).    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  Patient advised to follow-up with her primary care for further evaluation of abnormal findings of the x-ray of her cervical spine.  Return to cautions advised.  ED Prescriptions     Medication Sig Dispense Auth. Provider   ibuprofen (ADVIL) 600 MG tablet Take 1 tablet (600 mg total) by mouth every 8 (eight) hours as needed for up to 30 doses for fever, headache, mild pain or moderate pain (Inflammation). Take 1 tablet 3 times daily as needed for inflammation of upper airways and/or  pain. 30 tablet Lynden Oxford Scales, PA-C   baclofen (LIORESAL) 10 MG tablet Take 1 tablet (10 mg total) by  mouth 3 (three) times daily for 7 days. 21 tablet Theadora Rama Scales, PA-C      PDMP not reviewed this encounter.  Pending results:  Labs Reviewed - No data to display  Discharge Instructions:   Discharge Instructions      Your x-ray showed osteophytes and narrowing of the space where your spinal column passes through your cervical vertebra.  These types of degenerative changes are more often seen in athletes and cause symptoms such as numbness, tingling, weakness, decreased reflexes.  These changes can also be a early sign of herniated disc.  For this reason, it is important that you follow-up with orthopedics.  [facility] to be seen more quickly, perhaps they can help you get in with a spine specialist.  In the meantime, please begin ibuprofen 600 mg every 8 hours and baclofen 10 mg 3 times daily for relief of pain and muscle spasm.  You have not taken gabapentin in the past and unfortunately it does not appear to be covered by your insurance will not prescribe it for you at this time.    I appreciate your returning to urgent care to further discuss this issue.  I sincerely hope these changes help with your pain.      Disposition Upon Discharge:  Condition: stable for discharge home  Patient presented with an acute illness with associated systemic symptoms and significant discomfort requiring urgent management. In my opinion, this is a condition that a prudent lay person (someone who possesses an average knowledge of health and medicine) may potentially expect to result in complications if not addressed urgently such as respiratory distress, impairment of bodily function or dysfunction of bodily organs.   Routine symptom specific, illness specific and/or disease specific instructions were discussed with the patient  and/or caregiver at length.   As such, the patient has been evaluated and assessed, work-up was performed and treatment was provided in alignment with urgent care protocols and evidence based medicine.  Patient/parent/caregiver has been advised that the patient may require follow up for further testing and treatment if the symptoms continue in spite of treatment, as clinically indicated and appropriate.  Patient/parent/caregiver has been advised to return to the [facility] or PCP if no better; to PCP or the Emergency Department if new signs and symptoms develop, or if the current signs or symptoms continue to change or worsen for further workup, evaluation and treatment as clinically indicated and appropriate  The patient will follow up with their current PCP if and as advised. If the patient does not currently have a PCP we will assist them in obtaining one.   The patient may need specialty follow up if the symptoms continue, in spite of conservative treatment and management, for further workup, evaluation, consultation and treatment as clinically indicated and appropriate.   Patient/parent/caregiver verbalized understanding and agreement of plan as discussed.  All questions were addressed during visit.  Please see discharge instructions below for further details of plan.  This office note has been dictated using Teaching laboratory technician.  Unfortunately, this method of dictation can sometimes lead to typographical or grammatical errors.  I apologize for your inconvenience in advance if this occurs.  Please do not hesitate to reach out to me if clarification is needed.      Theadora Rama Scales, PA-C 04/20/22 1226

## 2022-04-18 DIAGNOSIS — M542 Cervicalgia: Secondary | ICD-10-CM | POA: Diagnosis not present

## 2022-04-19 ENCOUNTER — Encounter: Payer: Self-pay | Admitting: *Deleted

## 2022-04-19 ENCOUNTER — Ambulatory Visit: Payer: Medicaid Other | Admitting: Family Medicine

## 2022-04-19 ENCOUNTER — Other Ambulatory Visit (HOSPITAL_COMMUNITY)
Admission: RE | Admit: 2022-04-19 | Discharge: 2022-04-19 | Disposition: A | Discharge status: Home / Self Care | Payer: Medicaid Other | Source: Ambulatory Visit | Attending: Family Medicine | Admitting: Family Medicine

## 2022-04-19 ENCOUNTER — Encounter: Payer: Self-pay | Admitting: Family Medicine

## 2022-04-19 VITALS — BP 116/80 | HR 81 | Temp 98.3°F | Ht 62.0 in | Wt 117.5 lb

## 2022-04-19 DIAGNOSIS — Z124 Encounter for screening for malignant neoplasm of cervix: Secondary | ICD-10-CM | POA: Insufficient documentation

## 2022-04-19 DIAGNOSIS — Z114 Encounter for screening for human immunodeficiency virus [HIV]: Secondary | ICD-10-CM | POA: Diagnosis not present

## 2022-04-19 DIAGNOSIS — Z01419 Encounter for gynecological examination (general) (routine) without abnormal findings: Secondary | ICD-10-CM

## 2022-04-19 DIAGNOSIS — Z0000 Encounter for general adult medical examination without abnormal findings: Secondary | ICD-10-CM

## 2022-04-19 DIAGNOSIS — Z1159 Encounter for screening for other viral diseases: Secondary | ICD-10-CM | POA: Diagnosis not present

## 2022-04-19 LAB — LIPID PANEL
Cholesterol: 199 mg/dL (ref 0–200)
HDL: 75.2 mg/dL (ref >39.00)
LDL Cholesterol: 94 mg/dL (ref 0–99)
NonHDL: 123.57
Total CHOL/HDL Ratio: 3
Triglycerides: 150 mg/dL — ABNORMAL HIGH (ref 0.0–149.0)
VLDL: 30 mg/dL (ref 0.0–40.0)

## 2022-04-19 LAB — RESULTS CONSOLE HPV: CHL HPV: POSITIVE

## 2022-04-19 LAB — CBC WITH DIFFERENTIAL/PLATELET
Basophils Absolute: 0 10*3/uL (ref 0.0–0.1)
Basophils Relative: 0.7 % (ref 0.0–3.0)
Eosinophils Absolute: 0.1 10*3/uL (ref 0.0–0.7)
Eosinophils Relative: 1 % (ref 0.0–5.0)
HCT: 40.2 % (ref 36.0–46.0)
Hemoglobin: 13.7 g/dL (ref 12.0–15.0)
Lymphocytes Relative: 16.2 % (ref 12.0–46.0)
Lymphs Abs: 1.1 10*3/uL (ref 0.7–4.0)
MCHC: 34 g/dL (ref 30.0–36.0)
MCV: 94.8 fl (ref 78.0–100.0)
Monocytes Absolute: 0.5 10*3/uL (ref 0.1–1.0)
Monocytes Relative: 6.8 % (ref 3.0–12.0)
Neutro Abs: 5.4 10*3/uL (ref 1.4–7.7)
Neutrophils Relative %: 75.3 % (ref 43.0–77.0)
Platelets: 256 10*3/uL (ref 150.0–400.0)
RBC: 4.24 Mil/uL (ref 3.87–5.11)
RDW: 13 % (ref 11.5–15.5)
WBC: 7.1 10*3/uL (ref 4.0–10.5)

## 2022-04-19 LAB — COMPREHENSIVE METABOLIC PANEL
ALT: 10 U/L (ref 0–35)
AST: 12 U/L (ref 0–37)
Albumin: 4.5 g/dL (ref 3.5–5.2)
Alkaline Phosphatase: 40 U/L (ref 39–117)
BUN: 11 mg/dL (ref 6–23)
CO2: 30 mEq/L (ref 19–32)
Calcium: 9.4 mg/dL (ref 8.4–10.5)
Chloride: 100 mEq/L (ref 96–112)
Creatinine, Ser: 0.61 mg/dL (ref 0.40–1.20)
GFR: 118.61 mL/min (ref >60.00)
Glucose, Bld: 99 mg/dL (ref 70–99)
Potassium: 3.5 mEq/L (ref 3.5–5.1)
Sodium: 139 mEq/L (ref 135–145)
Total Bilirubin: 1.1 mg/dL (ref 0.2–1.2)
Total Protein: 7 g/dL (ref 6.0–8.3)

## 2022-04-19 LAB — TSH: TSH: 1.04 u[IU]/mL (ref 0.35–5.50)

## 2022-04-19 LAB — HEMOGLOBIN A1C: Hgb A1c MFr Bld: 4.9 % (ref 4.6–6.5)

## 2022-04-19 LAB — HM PAP SMEAR: HM Pap smear: ABNORMAL

## 2022-04-19 NOTE — Progress Notes (Signed)
Phone [phone]   Subjective:   Patient is a 31 y.o. female presenting for annual physical.    Chief Complaint  Patient presents with   Establish Care    Need new pcp Fasting Need annual exam    New pt-annual.  Has had 3 miscarriages. [id number] spont, 1 elective. Pap 07/16/18  See problem oriented charting- ROS- ROS: Gen: no fever, chills .  Tired a lot Skin: no rash, itching ENT: no ear pain, ear drainage, nasal congestion, rhinorrhea, sinus pressure, sore throat Eyes: no blurry vision, double vision Resp: no cough, wheeze,SOB.  Cough for 2 days-dau sick.  Was told in HS "one lung smaller than other".   CV: no CP, palpitations, LE edema,  GI: no heartburn, n/v/d/c, abd pain GU: no dysuria, urgency, frequency, hematuria.  Occ spotting-on Mirena.  Last pap in 2020.   MSK: some R shoulder,neck/arm.  No injury.  Pt req PT.  Pt L handed. Went to  [facility]. Neuro: no dizziness, headache, weakness, vertigo.  H/o syncope-from low blood sugars. Since 31yo.  Psych: no depression, anxiety, insomnia, SI   The following were reviewed and entered/updated in epic: Past Medical History:  Diagnosis Date   Allergy    Anemia    Arthritis    Miscarriage    Patient Active Problem List   Diagnosis Date Noted   Recurrent urinary tract infection 04/15/2022   Pain in pelvis 09/27/2021   Normal labor 09/26/2018   SVD (spontaneous vaginal delivery) 01/19/2016   Pregnancy 01/17/2016   ASCUS with positive high risk HPV cervical 10/20/2015   Group B streptococcal bacteriuria 09/20/2015   SYNCOPE 08/19/2008   DYSPNEA 08/19/2008   Past Surgical History:  Procedure Laterality Date   DILATION AND CURETTAGE OF UTERUS      Family History  Problem Relation Age of Onset   COPD Father    Heart disease Paternal Aunt 51   Heart disease Paternal Grandmother 55    Medications- reviewed and updated Current Outpatient Medications  Medication Sig Dispense Refill   levonorgestrel (MIRENA,  52 MG,) 20 MCG/DAY IUD Take 1 device by intrauterine route.     meloxicam (MOBIC) 15 MG tablet Take 15 mg by mouth daily.     tiZANidine (ZANAFLEX) 4 MG tablet Take 4 mg by mouth 3 (three) times daily. As needed     No current facility-administered medications for this visit.    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 DP 2+B LUNGS: CTAB. No wheezes ABDOMEN:  BS+, soft, NTND, No HSM, no masses EXT:  no edema MSK: no gross abnormalities. MS 5/5 all 4 NEURO: A&O x3.  CN II-XII intact.  PSYCH: normal mood. Good eye contact  Breasts: Examined lying  Right:   Without masses, retractions,  nipple discharge or axillary adenopathy.               Left:     Without masses, retractions, nipple discharge or axillary adenopathy. Genitourinary              Inguinal/mons:  Normal without inguinal adenopathy             External genitalia:  Normal appearing vulva with no masses, tenderness, or lesions             BUS/Urethra/Skene's glands:  Normal             Vagina:  Normal appearing with normal color and discharge, no lesions             Cervix:  Normal appearing without discharge or lesions. Some d/c.  Strings from IUD present             Uterus:  Normal in size, shape and contour.  Midline and mobile, nontender             Adnexa/parametria:                            Rt:        Normal in size, without masses or tenderness.                         Lt:        Normal in size, without masses or tenderness.             Anus and perineum: Normal  Chaperone present QJ     Assessment and Plan   Health Maintenance counseling: 1. Anticipatory guidance: Patient counseled regarding regular dental exams q6 months, eye exams,  avoiding smoking and second hand smoke, limiting alcohol to 1 beverage per day, no illicit drugs.   2. Risk factor reduction:  Advised patient of need for regular exercise and diet rich and fruits and vegetables to reduce risk of heart attack and stroke. Exercise- some.  Wt Readings from Last 3 Encounters:  04/19/22 117 lb 8 oz (53.3 kg)  09/25/18 142 lb 5 oz (64.6 kg)  06/18/18 125 lb (56.7 kg)   3. Immunizations/screenings/ancillary studies Immunization History  Administered Date(s) Administered   PFIZER(Purple Top)SARS-COV-2 Vaccination 02/16/2020, 03/08/2020   Tdap 01/20/2016, 07/16/2018   Health Maintenance Due  Topic Date Due   Hepatitis C Screening  Never done   PAP SMEAR-Modifier  Never done    4. Cervical cancer screening: ordered 5. Skin cancer screening- advised regular sunscreen use. Denies worrisome, changing, or new skin lesions.  6. Birth control/STD check: IUD 7. Smoking associated screening: non smoker 8. Alcohol screening: occ  Problem List Items Addressed This Visit   None Visit Diagnoses     Wellness examination    -  Primary   Relevant Orders   Comprehensive metabolic panel   Lipid panel   TSH   CBC with Differential/Platelet   Hemoglobin A1c   Hepatitis C antibody   HIV Antibody (routine testing w rflx)   Well woman exam with routine gynecological exam       Relevant Orders   Cytology - PAP( [place])   Screening for cervical cancer       Relevant Orders   Cytology - PAP( [place])   Screening for HIV without presence of risk factors       Relevant  Orders   HIV Antibody  (routine testing w rflx)   Encounter for hepatitis C screening test for low risk patient       Relevant Orders   Hepatitis C antibody      Wellness-anticipatory guidance.  Work on diet/exercise.  Check CBC,CMP,lipids,TSH, A1C.  F/u 31 yr  Female exam-pap sent  Recommended follow up: annualReturn in about 1 year (around 04/20/2023) for annual. No future appointments.  Lab/Order associations:+ fasting   ICD-10-CM   1. Wellness examination  Z00.00 Comprehensive metabolic panel    Lipid panel    TSH    CBC with Differential/Platelet    Hemoglobin A1c    Hepatitis C antibody    HIV Antibody (routine testing w rflx)    2. Well woman exam with routine gynecological exam  Z01.419 Cytology - PAP( [place])    3. Screening for cervical cancer  Z12.4 Cytology - PAP( [place])    4. Screening for HIV without presence of risk factors  Z11.4 HIV Antibody (routine testing w rflx)    5. Encounter for hepatitis C screening test for low risk patient  Z11.59 Hepatitis C antibody      No orders of the defined types were placed in this encounter.    Wellington Hampshire, MD

## 2022-04-19 NOTE — Patient Instructions (Signed)
Welcome to [facility] at [facility]! It was a pleasure meeting you today.  As discussed, Please schedule a 12 month follow up visit today.  Happy holidays!  PLEASE NOTE:  If you had any LAB tests please let us know if you have not heard back within a few days. You may see your results on MyChart before we have a chance to review them but we will give you a call once they are reviewed by us. If we ordered any REFERRALS today, please let us know if you have not heard from their office within the next week.  Let us know through MyChart if you are needing REFILLS, or have your pharmacy send us the request. You can also use MyChart to communicate with me or any office staff.  Please try these tips to maintain a healthy lifestyle:  Eat most of your calories during the day when you are active. Eliminate processed foods including packaged sweets (pies, cakes, cookies), reduce intake of potatoes, white bread, white pasta, and white rice. Look for whole grain options, oat flour or almond flour.  Each meal should contain half fruits/vegetables, one quarter protein, and one quarter carbs (no bigger than a computer mouse).  Cut down on sweet beverages. This includes juice, soda, and sweet tea. Also watch fruit intake, though this is a healthier sweet option, it still contains natural sugar! Limit to 3 servings daily.  Drink at least 1 glass of water with each meal and aim for at least 8 glasses per day  Exercise at least 150 minutes every week.   

## 2022-04-20 DIAGNOSIS — M5412 Radiculopathy, cervical region: Secondary | ICD-10-CM | POA: Diagnosis not present

## 2022-04-20 LAB — HEPATITIS C ANTIBODY: Hepatitis C Ab: NONREACTIVE

## 2022-04-20 LAB — HIV ANTIBODY (ROUTINE TESTING W REFLEX): HIV 1&2 Ab, 4th Generation: NONREACTIVE

## 2022-04-21 LAB — CYTOLOGY - PAP
Comment: NEGATIVE
Comment: NEGATIVE
Comment: NEGATIVE
HPV 16: NEGATIVE
HPV 18 / 45: NEGATIVE
High risk HPV: POSITIVE — AB

## 2022-04-23 NOTE — Progress Notes (Signed)
Pap is abnormal and HPV is positive-refer to gyn for colposcopy

## 2022-04-24 ENCOUNTER — Encounter: Payer: Self-pay | Admitting: Family Medicine

## 2022-04-24 ENCOUNTER — Other Ambulatory Visit: Payer: Self-pay | Admitting: *Deleted

## 2022-04-24 DIAGNOSIS — M542 Cervicalgia: Secondary | ICD-10-CM | POA: Diagnosis not present

## 2022-04-24 DIAGNOSIS — R87619 Unspecified abnormal cytological findings in specimens from cervix uteri: Secondary | ICD-10-CM

## 2022-04-24 DIAGNOSIS — M25511 Pain in right shoulder: Secondary | ICD-10-CM | POA: Diagnosis not present

## 2022-04-25 ENCOUNTER — Encounter: Payer: Self-pay | Admitting: Family Medicine

## 2022-05-03 DIAGNOSIS — M542 Cervicalgia: Secondary | ICD-10-CM | POA: Diagnosis not present

## 2022-05-03 DIAGNOSIS — M79601 Pain in right arm: Secondary | ICD-10-CM | POA: Diagnosis not present

## 2022-05-04 DIAGNOSIS — R87612 Low grade squamous intraepithelial lesion on cytologic smear of cervix (LGSIL): Secondary | ICD-10-CM | POA: Diagnosis not present

## 2022-05-04 DIAGNOSIS — M5412 Radiculopathy, cervical region: Secondary | ICD-10-CM | POA: Diagnosis not present

## 2022-05-13 DIAGNOSIS — M5412 Radiculopathy, cervical region: Secondary | ICD-10-CM | POA: Diagnosis not present

## 2022-05-16 DIAGNOSIS — M5412 Radiculopathy, cervical region: Secondary | ICD-10-CM | POA: Diagnosis not present

## 2022-05-19 DIAGNOSIS — M542 Cervicalgia: Secondary | ICD-10-CM | POA: Diagnosis not present

## 2022-05-19 DIAGNOSIS — M5412 Radiculopathy, cervical region: Secondary | ICD-10-CM | POA: Diagnosis not present

## 2022-05-31 DIAGNOSIS — M542 Cervicalgia: Secondary | ICD-10-CM | POA: Diagnosis not present

## 2022-05-31 DIAGNOSIS — M5412 Radiculopathy, cervical region: Secondary | ICD-10-CM | POA: Diagnosis not present

## 2022-06-06 ENCOUNTER — Ambulatory Visit (HOSPITAL_COMMUNITY): Payer: Self-pay | Admitting: Orthopedic Surgery

## 2022-06-19 NOTE — Pre-Procedure Instructions (Signed)
Surgical Instructions    Your procedure is scheduled on Monday, January 22nd.  Report to [facility] Main Entrance "A" at 05:30 A.M., then check in with the Admitting office.  Call this number if you have problems the morning of surgery:  [phone]  If you have any questions prior to your surgery date call [phone]: Open Monday-Friday 8am-4pm If you experience any cold or flu symptoms such as cough, fever, chills, shortness of breath, etc. between now and your scheduled surgery, please notify us at the above number.     Remember:  Do not eat after midnight the night before your surgery  You may drink clear liquids until 04:30 AM the morning of your surgery.   Clear liquids allowed are: Water, Non-Citrus Juices (without pulp), Carbonated Beverages, Clear Tea, Black Coffee Only (NO MILK, CREAM OR POWDERED CREAMER of any kind), and Gatorade.    Take these medicines the morning of surgery with A SIP OF WATER  gabapentin (NEURONTIN)- if needed  As of today, STOP taking any Aspirin (unless otherwise instructed by your surgeon) Aleve, Naproxen, Ibuprofen, Motrin, Advil, Goody's, BC's, all herbal medications, fish oil, and all vitamins. This includes meloxicam (MOBIC).                     Do NOT Smoke (Tobacco/Vaping) for 24 hours prior to your procedure.  If you use a CPAP at night, you may bring your mask/headgear for your overnight stay.   Contacts, glasses, piercing's, hearing aid's, dentures or partials may not be worn into surgery, please bring cases for these belongings.    For patients admitted to the hospital, discharge time will be determined by your treatment team.   Patients discharged the day of surgery will not be allowed to drive home, and someone needs to stay with them for 24 hours.  SURGICAL WAITING ROOM VISITATION Patients having surgery or a procedure may have no more than 2 support people in the waiting area - these visitors may rotate.   Children under the age  of 54 must have an adult with them who is not the patient. If the patient needs to stay at the hospital during part of their recovery, the visitor guidelines for inpatient rooms apply. Pre-op nurse will coordinate an appropriate time for 1 support person to accompany patient in pre-op.  This support person may not rotate.   Please refer to the [facility] website for the visitor guidelines for Inpatients (after your surgery is over and you are in a regular room).    Special instructions:   Center- Preparing For Surgery  Before surgery, you can play an important role. Because skin is not sterile, your skin needs to be as free of germs as possible. You can reduce the number of germs on your skin by washing with CHG (chlorahexidine gluconate) Soap before surgery.  CHG is an antiseptic cleaner which kills germs and bonds with the skin to continue killing germs even after washing.    Oral Hygiene is also important to reduce your risk of infection.  Remember - BRUSH YOUR TEETH THE MORNING OF SURGERY WITH YOUR REGULAR TOOTHPASTE  Please do not use if you have an allergy to CHG or antibacterial soaps. If your skin becomes reddened/irritated stop using the CHG.  Do not shave (including legs and underarms) for at least 48 hours prior to first CHG shower. It is OK to shave your face.  Please follow these instructions carefully.   Shower the [employer]  SURGERY and the MORNING OF SURGERY  If you chose to wash your hair, wash your hair first as usual with your normal shampoo.  After you shampoo, rinse your hair and body thoroughly to remove the shampoo.  Use CHG Soap as you would any other liquid soap. You can apply CHG directly to the skin and wash gently with a scrungie or a clean washcloth.   Apply the CHG Soap to your body ONLY FROM THE NECK DOWN.  Do not use on open wounds or open sores. Avoid contact with your eyes, ears, mouth and genitals (private parts). Wash Face and genitals (private  parts)  with your normal soap.   Wash thoroughly, paying special attention to the area where your surgery will be performed.  Thoroughly rinse your body with warm water from the neck down.  DO NOT shower/wash with your normal soap after using and rinsing off the CHG Soap.  Pat yourself dry with a CLEAN TOWEL.  Wear CLEAN PAJAMAS to bed the night before surgery  Place CLEAN SHEETS on your bed the night before your surgery  DO NOT SLEEP WITH PETS.   Day of Surgery: Take a shower with CHG soap. Do not wear jewelry or makeup Do not wear lotions, powders, perfumes, or deodorant. Do not shave 48 hours prior to surgery.   Do not bring valuables to the hospital. [facility] is not responsible for any belongings or valuables. Do not wear nail polish, gel polish, artificial nails, or any other type of covering on natural nails (fingers and toes) If you have artificial nails or gel coating that need to be removed by a nail salon, please have this removed prior to surgery. Artificial nails or gel coating may interfere with anesthesia's ability to adequately monitor your vital signs. Wear Clean/Comfortable clothing the morning of surgery Remember to brush your teeth WITH YOUR REGULAR TOOTHPASTE.   Please read over the following fact sheets that you were given.    If you received a COVID test during your pre-op visit  it is requested that you wear a mask when out in public, stay away from anyone that may not be feeling well and notify your surgeon if you develop symptoms. If you have been in contact with anyone that has tested positive in the last 10 days please notify you surgeon.

## 2022-06-20 ENCOUNTER — Encounter (HOSPITAL_COMMUNITY)
Admission: RE | Admit: 2022-06-20 | Discharge: 2022-06-20 | Disposition: A | Discharge status: Home / Self Care | Payer: Medicaid Other | Source: Ambulatory Visit | Attending: Orthopedic Surgery | Admitting: Orthopedic Surgery

## 2022-06-20 ENCOUNTER — Other Ambulatory Visit: Payer: Self-pay

## 2022-06-20 ENCOUNTER — Encounter (HOSPITAL_COMMUNITY): Payer: Self-pay

## 2022-06-20 VITALS — BP 120/81 | HR 72 | Temp 98.0°F | Resp 16 | Ht 62.0 in | Wt 120.0 lb

## 2022-06-20 DIAGNOSIS — Z01818 Encounter for other preprocedural examination: Secondary | ICD-10-CM | POA: Diagnosis not present

## 2022-06-20 LAB — CBC
HCT: 39.7 % (ref 36.0–46.0)
Hemoglobin: 13.3 g/dL (ref 12.0–15.0)
MCH: 31.7 pg (ref 26.0–34.0)
MCHC: 33.5 g/dL (ref 30.0–36.0)
MCV: 94.5 fL (ref 80.0–100.0)
Platelets: 217 10*3/uL (ref 150–400)
RBC: 4.2 MIL/uL (ref 3.87–5.11)
RDW: 12.1 % (ref 11.5–15.5)
WBC: 10.3 10*3/uL (ref 4.0–10.5)
nRBC: 0 % (ref 0.0–0.2)

## 2022-06-20 LAB — SURGICAL PCR SCREEN
MRSA, PCR: NEGATIVE
Staphylococcus aureus: POSITIVE — AB

## 2022-06-20 NOTE — Progress Notes (Signed)
PCP - Tawnya Crook, MD Cardiologist - Denies  PPM/ICD - Denies   Chest x-ray - 08/19/2018 EKG - 06/19/2018 Stress Test - Denies ECHO - Denies Cardiac Cath - Denies  Sleep Study - Denies  DM: Denies   Blood Thinner Instructions: N/A Aspirin Instructions: N/A  ERAS Protcol - ERAS PRE-SURGERY Ensure or G2- N/A  COVID TEST- No   Anesthesia review: Yes, per Dr. Rolena Infante   Patient denies shortness of breath, fever, cough and chest pain at PAT appointment   All instructions explained to the patient, with a verbal understanding of the material. Patient agrees to go over the instructions while at home for a better understanding. The opportunity to ask questions was provided.

## 2022-06-25 NOTE — Anesthesia Preprocedure Evaluation (Addendum)
Anesthesia Evaluation  Patient identified by MRN, date of birth, ID band Patient awake    Reviewed: Allergy & Precautions, NPO status , Patient's Chart, lab work & pertinent test results  Airway Mallampati: II  TM Distance: >3 FB Neck ROM: Full    Dental no notable dental hx. (+) Teeth Intact, Dental Advisory Given   Pulmonary neg pulmonary ROS   Pulmonary exam normal breath sounds clear to auscultation       Cardiovascular Exercise Tolerance: Good negative cardio ROS Normal cardiovascular exam Rhythm:Regular Rate:Normal     Neuro/Psych negative neurological ROS  negative psych ROS   GI/Hepatic negative GI ROS,,,  Endo/Other    Renal/GU negative Renal ROS     Musculoskeletal  (+) Arthritis ,    Abdominal   Peds  Hematology Lab Results      Component                Value               Date                      WBC                      10.3                06/20/2022                HGB                      13.3                06/20/2022                HCT                      39.7                06/20/2022                MCV                      94.5                06/20/2022                PLT                      217                 06/20/2022              Anesthesia Other Findings All: shellfish,  Reproductive/Obstetrics negative OB ROS                             Anesthesia Physical Anesthesia Plan  ASA: 1  Anesthesia Plan: General   Post-op Pain Management: Dilaudid IV, Ofirmev IV (intra-op)* and Ketamine IV*   Induction: Intravenous  PONV Risk Score and Plan: 4 or greater and Treatment may vary due to age or medical condition, Midazolam, Ondansetron and Dexamethasone  Airway Management Planned: Oral ETT and Video Laryngoscope Planned  Additional Equipment: None  Intra-op Plan:   Post-operative Plan: Extubation in OR  Informed Consent: I have reviewed the patients  History and Physical, chart, labs and discussed the procedure including the risks,  benefits and alternatives for the proposed anesthesia with the patient or authorized representative who has indicated his/her understanding and acceptance.     Dental advisory given  Plan Discussed with: CRNA  Anesthesia Plan Comments: (C5-6 R radiculopathy)       Anesthesia Quick Evaluation

## 2022-06-26 ENCOUNTER — Encounter (HOSPITAL_COMMUNITY)
Admission: RE | Disposition: A | Discharge status: Home / Self Care | Payer: Self-pay | Source: Home / Self Care | Attending: Orthopedic Surgery

## 2022-06-26 ENCOUNTER — Ambulatory Visit (HOSPITAL_COMMUNITY): Payer: Medicaid Other | Admitting: Anesthesiology

## 2022-06-26 ENCOUNTER — Other Ambulatory Visit: Payer: Self-pay

## 2022-06-26 ENCOUNTER — Ambulatory Visit (HOSPITAL_BASED_OUTPATIENT_CLINIC_OR_DEPARTMENT_OTHER): Payer: Medicaid Other | Admitting: Anesthesiology

## 2022-06-26 ENCOUNTER — Ambulatory Visit (HOSPITAL_COMMUNITY)
Admission: RE | Admit: 2022-06-26 | Discharge: 2022-06-26 | Disposition: A | Discharge status: Home / Self Care | Payer: Medicaid Other | Attending: Orthopedic Surgery | Admitting: Orthopedic Surgery

## 2022-06-26 ENCOUNTER — Ambulatory Visit (HOSPITAL_COMMUNITY): Payer: Medicaid Other

## 2022-06-26 ENCOUNTER — Encounter (HOSPITAL_COMMUNITY): Payer: Self-pay | Admitting: Orthopedic Surgery

## 2022-06-26 DIAGNOSIS — M50122 Cervical disc disorder at C5-C6 level with radiculopathy: Secondary | ICD-10-CM | POA: Diagnosis not present

## 2022-06-26 DIAGNOSIS — M2578 Osteophyte, vertebrae: Secondary | ICD-10-CM | POA: Diagnosis not present

## 2022-06-26 DIAGNOSIS — Z01818 Encounter for other preprocedural examination: Secondary | ICD-10-CM

## 2022-06-26 DIAGNOSIS — M5412 Radiculopathy, cervical region: Secondary | ICD-10-CM | POA: Diagnosis not present

## 2022-06-26 DIAGNOSIS — M50222 Other cervical disc displacement at C5-C6 level: Secondary | ICD-10-CM | POA: Diagnosis not present

## 2022-06-26 HISTORY — PX: CERVICAL DISC ARTHROPLASTY: SHX587

## 2022-06-26 LAB — POCT PREGNANCY, URINE: Preg Test, Ur: NEGATIVE

## 2022-06-26 SURGERY — CERVICAL ANTERIOR DISC ARTHROPLASTY
Anesthesia: General | Site: Neck

## 2022-06-26 MED ORDER — SUGAMMADEX SODIUM 200 MG/2ML IV SOLN
INTRAVENOUS | Status: DC | PRN
  Administered 2022-06-26: 200 mg via INTRAVENOUS

## 2022-06-26 MED ORDER — PROPOFOL 10 MG/ML IV BOLUS
INTRAVENOUS | Status: DC | PRN
  Administered 2022-06-26: 100 mg via INTRAVENOUS
  Administered 2022-06-26: 20 mg via INTRAVENOUS

## 2022-06-26 MED ORDER — FENTANYL CITRATE (PF) 250 MCG/5ML IJ SOLN
INTRAMUSCULAR | Status: DC | PRN
  Administered 2022-06-26: 50 ug via INTRAVENOUS
  Administered 2022-06-26: 100 ug via INTRAVENOUS
  Administered 2022-06-26 (×2): 50 ug via INTRAVENOUS

## 2022-06-26 MED ORDER — LACTATED RINGERS IV SOLN: INTRAVENOUS | Status: DC

## 2022-06-26 MED ORDER — TRANEXAMIC ACID-NACL 1000-0.7 MG/100ML-% IV SOLN
INTRAVENOUS | Status: AC
  Filled 2022-06-26: qty 100

## 2022-06-26 MED ORDER — OXYCODONE HCL 5 MG PO TABS: 5.0000 mg | ORAL_TABLET | Freq: Once | ORAL | Status: AC | PRN

## 2022-06-26 MED ORDER — ROCURONIUM BROMIDE 10 MG/ML (PF) SYRINGE
PREFILLED_SYRINGE | INTRAVENOUS | Status: DC | PRN
  Administered 2022-06-26: 10 mg via INTRAVENOUS
  Administered 2022-06-26: 40 mg via INTRAVENOUS
  Administered 2022-06-26 (×3): 10 mg via INTRAVENOUS

## 2022-06-26 MED ORDER — KETAMINE HCL 10 MG/ML IJ SOLN
INTRAMUSCULAR | Status: DC | PRN
  Administered 2022-06-26: 30 mg via INTRAVENOUS

## 2022-06-26 MED ORDER — SURGIFLO WITH THROMBIN (HEMOSTATIC MATRIX KIT) OPTIME
TOPICAL | Status: DC | PRN
  Administered 2022-06-26: 1 via TOPICAL

## 2022-06-26 MED ORDER — DEXAMETHASONE SODIUM PHOSPHATE 10 MG/ML IJ SOLN
INTRAMUSCULAR | Status: DC | PRN
  Administered 2022-06-26: 5 mg via INTRAVENOUS

## 2022-06-26 MED ORDER — DEXAMETHASONE SODIUM PHOSPHATE 10 MG/ML IJ SOLN
INTRAMUSCULAR | Status: AC
  Filled 2022-06-26: qty 1

## 2022-06-26 MED ORDER — ACETAMINOPHEN 10 MG/ML IV SOLN: 1000.0000 mg | Freq: Once | INTRAVENOUS | Status: DC | PRN

## 2022-06-26 MED ORDER — METHOCARBAMOL 500 MG PO TABS
ORAL_TABLET | ORAL | Status: AC
  Administered 2022-06-26: 500 mg
  Filled 2022-06-26: qty 1

## 2022-06-26 MED ORDER — OXYCODONE HCL 5 MG/5ML PO SOLN: 5.0000 mg | Freq: Once | ORAL | Status: AC | PRN

## 2022-06-26 MED ORDER — THROMBIN 20000 UNITS EX SOLR: CUTANEOUS | Status: DC | PRN

## 2022-06-26 MED ORDER — OXYCODONE-ACETAMINOPHEN 10-325 MG PO TABS: 1.0000 | ORAL_TABLET | Freq: Four times a day (QID) | ORAL | 0 refills | Status: AC | PRN

## 2022-06-26 MED ORDER — THROMBIN 20000 UNITS EX SOLR
CUTANEOUS | Status: DC | PRN
  Administered 2022-06-26: 20000 [IU] via TOPICAL

## 2022-06-26 MED ORDER — MUPIROCIN 2 % EX OINT
TOPICAL_OINTMENT | CUTANEOUS | Status: AC
  Filled 2022-06-26: qty 22

## 2022-06-26 MED ORDER — THROMBIN 20000 UNITS EX SOLR
CUTANEOUS | Status: AC
  Filled 2022-06-26: qty 20000

## 2022-06-26 MED ORDER — FENTANYL CITRATE (PF) 250 MCG/5ML IJ SOLN
INTRAMUSCULAR | Status: AC
  Filled 2022-06-26: qty 5

## 2022-06-26 MED ORDER — CEFAZOLIN SODIUM-DEXTROSE 2-4 GM/100ML-% IV SOLN
INTRAVENOUS | Status: AC
  Filled 2022-06-26: qty 100

## 2022-06-26 MED ORDER — LIDOCAINE 2% (20 MG/ML) 5 ML SYRINGE
INTRAMUSCULAR | Status: DC | PRN
  Administered 2022-06-26: 100 mg via INTRAVENOUS

## 2022-06-26 MED ORDER — METHOCARBAMOL 500 MG PO TABS: 500.0000 mg | ORAL_TABLET | Freq: Three times a day (TID) | ORAL | 0 refills | Status: AC | PRN

## 2022-06-26 MED ORDER — TRANEXAMIC ACID-NACL 1000-0.7 MG/100ML-% IV SOLN
1000.0000 mg | INTRAVENOUS | Status: AC
  Administered 2022-06-26: 1000 mg via INTRAVENOUS

## 2022-06-26 MED ORDER — ORAL CARE MOUTH RINSE: 15.0000 mL | Freq: Once | OROMUCOSAL | Status: AC

## 2022-06-26 MED ORDER — STERILE WATER FOR IRRIGATION IR SOLN
Status: DC | PRN
  Administered 2022-06-26: 1000 mL

## 2022-06-26 MED ORDER — ACETAMINOPHEN 10 MG/ML IV SOLN
INTRAVENOUS | Status: DC | PRN
  Administered 2022-06-26: 1000 mg via INTRAVENOUS

## 2022-06-26 MED ORDER — EPINEPHRINE PF 1 MG/ML IJ SOLN
INTRAMUSCULAR | Status: AC
  Filled 2022-06-26: qty 1

## 2022-06-26 MED ORDER — ROCURONIUM BROMIDE 10 MG/ML (PF) SYRINGE
PREFILLED_SYRINGE | INTRAVENOUS | Status: AC
  Filled 2022-06-26: qty 10

## 2022-06-26 MED ORDER — ONDANSETRON HCL 4 MG/2ML IJ SOLN
INTRAMUSCULAR | Status: AC
  Filled 2022-06-26: qty 2

## 2022-06-26 MED ORDER — MIDAZOLAM HCL 2 MG/2ML IJ SOLN
INTRAMUSCULAR | Status: AC
  Filled 2022-06-26: qty 2

## 2022-06-26 MED ORDER — BUPIVACAINE HCL (PF) 0.25 % IJ SOLN
INTRAMUSCULAR | Status: DC | PRN
  Administered 2022-06-26: 4 mL

## 2022-06-26 MED ORDER — CHLORHEXIDINE GLUCONATE 0.12 % MT SOLN
OROMUCOSAL | Status: AC
  Filled 2022-06-26: qty 15

## 2022-06-26 MED ORDER — EPINEPHRINE PF 1 MG/ML IJ SOLN
INTRAMUSCULAR | Status: DC | PRN
  Administered 2022-06-26: .15 mL

## 2022-06-26 MED ORDER — KETAMINE HCL 50 MG/5ML IJ SOSY
PREFILLED_SYRINGE | INTRAMUSCULAR | Status: AC
  Filled 2022-06-26: qty 5

## 2022-06-26 MED ORDER — OXYCODONE HCL 5 MG PO TABS
ORAL_TABLET | ORAL | Status: AC
  Administered 2022-06-26: 5 mg via ORAL
  Filled 2022-06-26: qty 1

## 2022-06-26 MED ORDER — CHLORHEXIDINE GLUCONATE 0.12 % MT SOLN
15.0000 mL | Freq: Once | OROMUCOSAL | Status: AC
  Administered 2022-06-26: 15 mL via OROMUCOSAL

## 2022-06-26 MED ORDER — ONDANSETRON HCL 4 MG PO TABS: 4.0000 mg | ORAL_TABLET | Freq: Three times a day (TID) | ORAL | 0 refills | Status: DC | PRN

## 2022-06-26 MED ORDER — ONDANSETRON HCL 4 MG/2ML IJ SOLN
INTRAMUSCULAR | Status: DC | PRN
  Administered 2022-06-26: 4 mg via INTRAVENOUS

## 2022-06-26 MED ORDER — CEFAZOLIN SODIUM-DEXTROSE 2-4 GM/100ML-% IV SOLN
2.0000 g | INTRAVENOUS | Status: AC
  Administered 2022-06-26: 2 g via INTRAVENOUS

## 2022-06-26 MED ORDER — ONDANSETRON HCL 4 MG/2ML IJ SOLN: 4.0000 mg | Freq: Once | INTRAMUSCULAR | Status: DC | PRN

## 2022-06-26 MED ORDER — 0.9 % SODIUM CHLORIDE (POUR BTL) OPTIME
TOPICAL | Status: DC | PRN
  Administered 2022-06-26: 1000 mL

## 2022-06-26 MED ORDER — PROPOFOL 10 MG/ML IV BOLUS
INTRAVENOUS | Status: AC
  Filled 2022-06-26: qty 20

## 2022-06-26 MED ORDER — MIDAZOLAM HCL 2 MG/2ML IJ SOLN
INTRAMUSCULAR | Status: DC | PRN
  Administered 2022-06-26: 2 mg via INTRAVENOUS

## 2022-06-26 MED ORDER — BUPIVACAINE HCL (PF) 0.25 % IJ SOLN
INTRAMUSCULAR | Status: AC
  Filled 2022-06-26: qty 30

## 2022-06-26 MED ORDER — HYDROMORPHONE HCL 1 MG/ML IJ SOLN: 0.2500 mg | INTRAMUSCULAR | Status: DC | PRN

## 2022-06-26 MED ORDER — LIDOCAINE 2% (20 MG/ML) 5 ML SYRINGE
INTRAMUSCULAR | Status: AC
  Filled 2022-06-26: qty 5

## 2022-06-26 MED ORDER — ACETAMINOPHEN 10 MG/ML IV SOLN
INTRAVENOUS | Status: AC
  Filled 2022-06-26: qty 100

## 2022-06-26 SURGICAL SUPPLY — 61 items
BAG COUNTER SPONGE SURGICOUNT (BAG) ×1 IMPLANT
BLADE CLIPPER SURG (BLADE) IMPLANT
CANISTER SUCT 3000ML PPV (MISCELLANEOUS) ×1 IMPLANT
CLSR STERI-STRIP ANTIMIC 1/2X4 (GAUZE/BANDAGES/DRESSINGS) ×1 IMPLANT
COLLAR CERV LO CONTOUR FIRM DE (SOFTGOODS) IMPLANT
COVER MAYO STAND STRL (DRAPES) ×2 IMPLANT
COVER SURGICAL LIGHT HANDLE (MISCELLANEOUS) ×2 IMPLANT
DISC SIMPLIFY SIZE 1 HT 4 (Miscellaneous) IMPLANT
DRAIN CHANNEL 15F RND FF W/TCR (WOUND CARE) IMPLANT
DRAPE C-ARM 42X72 X-RAY (DRAPES) ×1 IMPLANT
DRAPE C-ARMOR (DRAPES) IMPLANT
DRAPE POUCH INSTRU U-SHP 10X18 (DRAPES) ×1 IMPLANT
DRAPE SURG 17X23 STRL (DRAPES) ×1 IMPLANT
DRAPE U-SHAPE 47X51 STRL (DRAPES) ×1 IMPLANT
DRSG OPSITE POSTOP 3X4 (GAUZE/BANDAGES/DRESSINGS) ×1 IMPLANT
DURAPREP 26ML APPLICATOR (WOUND CARE) ×1 IMPLANT
ELECT COATED BLADE 2.86 ST (ELECTRODE) ×1 IMPLANT
ELECT PENCIL ROCKER SW 15FT (MISCELLANEOUS) ×1 IMPLANT
ELECT REM PT RETURN 9FT ADLT (ELECTROSURGICAL) ×1
ELECTRODE REM PT RTRN 9FT ADLT (ELECTROSURGICAL) ×1 IMPLANT
GLOVE BIO SURGEON STRL SZ 6.5 (GLOVE) ×1 IMPLANT
GLOVE BIOGEL PI IND STRL 6.5 (GLOVE) ×1 IMPLANT
GLOVE BIOGEL PI IND STRL 8.5 (GLOVE) ×1 IMPLANT
GLOVE SS BIOGEL STRL SZ 8.5 (GLOVE) ×1 IMPLANT
GOWN STRL REUS W/ TWL LRG LVL3 (GOWN DISPOSABLE) ×2 IMPLANT
GOWN STRL REUS W/TWL 2XL LVL3 (GOWN DISPOSABLE) ×1 IMPLANT
GOWN STRL REUS W/TWL LRG LVL3 (GOWN DISPOSABLE) ×2
KIT BASIN OR (CUSTOM PROCEDURE TRAY) ×1 IMPLANT
KIT TURNOVER KIT B (KITS) ×1 IMPLANT
NDL 18GX1X1/2 (RX/OR ONLY) (NEEDLE) IMPLANT
NDL HYPO 18GX1.5 BLUNT FILL (NEEDLE) IMPLANT
NDL SPNL 18GX3.5 QUINCKE PK (NEEDLE) ×1 IMPLANT
NEEDLE 18GX1X1/2 (RX/OR ONLY) (NEEDLE) ×1 IMPLANT
NEEDLE HYPO 18GX1.5 BLUNT FILL (NEEDLE) ×1 IMPLANT
NEEDLE SPNL 18GX3.5 QUINCKE PK (NEEDLE) ×1 IMPLANT
NS IRRIG 1000ML POUR BTL (IV SOLUTION) ×1 IMPLANT
PACK ORTHO CERVICAL (CUSTOM PROCEDURE TRAY) ×1 IMPLANT
PACK UNIVERSAL I (CUSTOM PROCEDURE TRAY) ×1 IMPLANT
PAD ARMBOARD 7.5X6 YLW CONV (MISCELLANEOUS) ×2 IMPLANT
PIN DISTRACTION MAXCESS-C 14 (PIN) IMPLANT
POSITIONER HEAD DONUT 9IN (MISCELLANEOUS) ×1 IMPLANT
RESTRAINT LIMB HOLDER UNIV (RESTRAINTS) ×1 IMPLANT
SPONGE INTESTINAL PEANUT (DISPOSABLE) IMPLANT
SPONGE SURGIFOAM ABS GEL SZ50 (HEMOSTASIS) ×1 IMPLANT
SURGIFLO W/THROMBIN 8M KIT (HEMOSTASIS) ×1 IMPLANT
SUT BONE WAX W31G (SUTURE) ×1 IMPLANT
SUT MNCRL AB 3-0 PS2 27 (SUTURE) ×1 IMPLANT
SUT SILK 2 0 (SUTURE) ×1
SUT SILK 2-0 18XBRD TIE 12 (SUTURE) IMPLANT
SUT SILK 3 0 TIES 17X18 (SUTURE)
SUT SILK 3-0 18XBRD TIE BLK (SUTURE) IMPLANT
SUT VIC AB 2-0 CT1 18 (SUTURE) ×1 IMPLANT
SYR BULB IRRIG 60ML STRL (SYRINGE) ×1 IMPLANT
SYR CONTROL 10ML LL (SYRINGE) IMPLANT
SYR TB 1ML LUER SLIP (SYRINGE) IMPLANT
TAPE CLOTH 4X10 WHT NS (GAUZE/BANDAGES/DRESSINGS) ×1 IMPLANT
TAPE UMBILICAL 1/8X30 (MISCELLANEOUS) IMPLANT
TAPE UMBILICAL COTTON 1/8X30 (MISCELLANEOUS) ×1 IMPLANT
TOWEL GREEN STERILE (TOWEL DISPOSABLE) ×1 IMPLANT
TOWEL GREEN STERILE FF (TOWEL DISPOSABLE) ×1 IMPLANT
WATER STERILE IRR 1000ML POUR (IV SOLUTION) ×1 IMPLANT

## 2022-06-26 NOTE — Op Note (Signed)
OPERATIVE REPORT  DATE OF SURGERY: 06/26/2022  PATIENT NAME:  Teresa Lucas MRN: [id number] DOB: 1990/09/19  PCP: Tawnya Crook, MD  PRE-OPERATIVE DIAGNOSIS:.  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 female who to my office with complaints of severe neck and radicular right arm pain.  Patient had both motor and sensory deficits in debilitating neuropathic pain.  As result of the C5-6 disc herniation with nerve compression and the loss in quality of life we elected to move forward with surgery.  All appropriate risks, benefits, alternatives to surgery were discussed with the patient and consent was obtained.  PROCEDURE DETAILS: Patient was brought into the operating room and was properly positioned on the operating room table.  After induction with general anesthesia the patient was endotracheally intubated.  A timeout was taken to confirm all important data: including patient, procedure, and the level. Teds, SCD's were applied.   The anterior cervical spine was prepped and draped in a standard fashion.  Using fluoroscopy I marked out my incision site centered over the C5-6 disc space.  I infiltrated the incision with quarter percent Marcaine with epinephrine and then made a small transverse incision starting from the midline and proceeding to the left.  I sharply dissected down to the platysma.  I incised the platysma and then continued my dissection along the avascular plane keeping the trachea and esophagus to the right and the carotid sheath to my left.  Once I could palpate and visualize the anterior longitudinal ligament I used my Kitner dissectors to mobilize and completely expose the  C5-6 disc space.  A needle was placed into the disc space and an x-ray was taken confirming I was at the appropriate level.  Bovie was used to mark the disc space.  Using bipolar cautery I mobilized the longus coli muscles from the midportion of C5 down to the midportion of C6.  This was done bilaterally.  Caspar retracting blades were then placed underneath the longus coli muscle and the endotracheal cuff was deflated.  The retracting system was expanded to the appropriate width and the endotracheal cuff was inflated.  An annulotomy was performed with a 15 blade scalpel and I remove the bulk of the disc material with pituitary rongeurs.  The overhanging osteophyte from the inferior aspect of the C5 vertebral body was resected with a 2 mm Kerrison rongeur using curettes continued removing the disc space until I could see the posterior annulus.  All of the cartilaginous endplate was removed.  In the right posterior lateral corner there was evidence of disc herniation.  Using a micropituitary rongeurs as well as a fine nerve hook I was able to tease out 2 fragments of disc material.  This allowed me to develop a plane under the posterior longitudinal ligament and resect the posterior longitudinal ligament with a 1 mm Kerrison rongeur.  Osteophyte from the undersurface of the uncovertebral joint was taken down for further neural decompression.  This point I visualized the thecal sac and easily pass my nerve hook underneath the uncovertebral joint and the posterior aspect of the vertebral bodies.  With the decompression/discectomy complete I move forward with the implantation of the device.  The trialing device  was placed and elected to use the size 4 mm small implant.  Once I confirmed proper depth and position of the trial I removed the trial and then placed the osteotome to create the fin cuts into the bodies of C5 and C6.  I confirmed satisfactory depth with fluoroscopy.  The osteotome was removed and the  implant obtained.  Wound was then copiously irrigated with normal saline and hemostasis was obtained using Floseal and bipolar electrocautery.  After final irrigation I obtained the implant and gently malleted into position.  I confirmed satisfactory position in both the AP and lateral planes.  I then removed all the retractors and irrigated wound copiously normal saline.  Any bleeding bone edges were sealed with bone wax.  The trach and esophagus were returned to midline and the platysma was closed with interrupted 2-0 Vicryl suture, and the skin with a 3-0 Monocryl.  Steri-Strips and dry dressing were applied and the patient was ultimately extubated and transferred to PACU without incident.  The end of the case all needle sponge counts were correct.  There were no adverse operative events.  Melina Schools, MD 06/26/2022 10:21 AM

## 2022-06-26 NOTE — Transfer of Care (Signed)
Immediate Anesthesia Transfer of Care Note  Patient: Teresa Lucas  Procedure(s) Performed: CERVICAL DISC REPLACEMENT CERVICAL FIVE TO SIX (Neck)  Patient Location: PACU  Anesthesia Type:General  Level of Consciousness: awake, alert , and oriented  Airway & Oxygen Therapy: Patient Spontanous Breathing and Patient connected to nasal cannula oxygen  Post-op Assessment: Report given to RN, Post -op Vital signs reviewed and stable, and Patient moving all extremities X 4  Post vital signs: Reviewed and stable  Last Vitals:  Vitals Value Taken Time  BP    Temp    Pulse 92 06/26/22 1033  Resp 23 06/26/22 1033  SpO2 99 % 06/26/22 1033  Vitals shown include unvalidated device data.  Last Pain:  Vitals:   06/26/22 0623  PainSc: 6       Patients Stated Pain Goal: 0 (71/24/58 0998)  Complications: No notable events documented.

## 2022-06-26 NOTE — Brief Op Note (Signed)
06/26/2022  10:28 AM  PATIENT:  Teresa Lucas  32 y.o. female  PRE-OPERATIVE DIAGNOSIS:  Cervical herniated disc with Right C6 radiculopathy  POST-OPERATIVE DIAGNOSIS:  Cervical herniated disc with Right C6 radiculopathy  PROCEDURE:  Procedure(s) with comments: CERVICAL DISC REPLACEMENT CERVICAL FIVE TO SIX (N/A) - 3 hrs  SURGEON:  Surgeon(s) and Role:    Melina Schools, MD - Primary  PHYSICIAN ASSISTANT:   ASSISTANTS: none   ANESTHESIA:   general  EBL:  20 mL   BLOOD ADMINISTERED:none  DRAINS: none   LOCAL MEDICATIONS USED:  MARCAINE     SPECIMEN:  No Specimen  DISPOSITION OF SPECIMEN:  N/A  COUNTS:  YES  TOURNIQUET:  * No tourniquets in log *  DICTATION: .Dragon Dictation  PLAN OF CARE: Discharge to home after PACU  PATIENT DISPOSITION:  PACU - hemodynamically stable.

## 2022-06-26 NOTE — H&P (Signed)
History: Teresa Lucas.  Very pleasant otherwise healthy 32 year old female with significant neck and right radicular arm pain, numbness, as well as weakness.  Imaging studies confirmed a C5-6 disc herniation posterior lateral to the right causing compression of the C6 nerve root.  Patient was noted to have weakness in the C6 distribution as well as a positive nerve tension sign.  As result of the neurological deficits, significant pain, and loss of quality of life we have elected to move forward with surgery.  All appropriate risks benefits and alternatives were discussed with the patient and her significant other and consent was obtained.  Surgical plan is disc arthroplasty C5-6.  Past Medical History:  Diagnosis Date   Allergy    Anemia    Arthritis    Miscarriage     Allergies  Allergen Reactions   Prednisone Nausea And Vomiting and Palpitations   Shellfish Allergy Nausea And Vomiting   Shellfish-Derived Products Other (See Comments)    No current facility-administered medications on file prior to encounter.   Current Outpatient Medications on File Prior to Encounter  Medication Sig Dispense Refill   gabapentin (NEURONTIN) 300 MG capsule Take 300 mg by mouth 3 (three) times daily as needed (pain).     levonorgestrel (MIRENA, 52 MG,) 20 MCG/DAY IUD 1 each by Intrauterine route once.     meloxicam (MOBIC) 15 MG tablet Take 15 mg by mouth daily.     tizanidine (ZANAFLEX) 6 MG capsule Take 6 mg by mouth at bedtime. As needed      Physical Exam: Vitals:   06/26/22 0557  BP: 121/86  Pulse: 78  Resp: 17  Temp: 98.1 F (36.7 C)  SpO2: 97%   Body mass index is 21.03 kg/m. Clinical exam: Teresa Lucas is a pleasant individual, who appears younger than their stated age.  She is alert and orientated 3.  No shortness of breath, chest pain.  Abdomen is soft and non-tender, negative loss of bowel and bladder control, no rebound tenderness.  Negative: skin lesions abrasions  contusions  Peripheral pulses: 2+ peripheral pulses bilaterally. LE compartments are: Soft and nontender.  Gait pattern: Normal  Assistive devices: None  Neuro: 4/5 right bicep and wrist extensor strength. 5/5 motor strength in the remainder of the right upper extremity. 5/5 motor strength in the left upper extremity. Positive numbness and dysesthesias in the right C6 dermatome. Positive Spurling sign with reproduction of right radicular arm pain. Negative Hoffman test. 1+ symmetrical deep tendon reflexes throughout.  Musculoskeletal: Moderate to severe neck pain radiating into the right upper extremity. No shoulder, elbow, wrist pain with isolated joint range of motion.  Imaging: X-rays of the cervical spine demonstrate no acute fracture. Disc heights are all well-maintained. Patient has a neutral spine.  Cervical MRI: completed on 05/13/2022 was reviewed with the patient. [facility] was [facility] at [facility]; I have independently reviewed the images as well as the radiology report. No cord signal changes. Severe right C5-6 neuroforaminal narrowing due to hard disc osteophyte producing marked compression of the right C6 nerve root. Mild degenerative disease C2-4. No disc herniation or stenosis C4-5, or C6-7.   A/P: Summary: Teresa Lucas is a very pleasant otherwise healthy 32 year old woman who has had 4 months of progressive neck and radicular right arm pain. She is had aggressive physical therapy including dry needling, and traction. She is also had Medrol Dosepak and other medications but unfortunately her quality of life is deteriorated significantly. Her clinical exam demonstrates both motor and sensory deficits  in the right C6 dermatome. Imaging studies clearly demonstrate neural compression at C5-6 affecting the C6 nerve root. The patient's clinical exam and imaging studies both indicate C6 nerve compression with both motor and sensory deficits and positive nerve root tension signs. Fortunately she  does not have any signs of cord signal changes or myelopathy.  At this point time given the failure of conservative treatment to improve her quality of life she is expressed a desire to move forward with surgery. Based on her clinical presentation and imaging studies I think the best option is a cervical disc arthroplasty at C5-6. This would allow for removal of the offending agent causing the nerve compression thereby decompressing the C6 nerve. We would then provide stability by placing the intervertebral disc arthroplasty. I have gone over the surgery in great detail with the patient and her significant other and all their questions were addressed. Risks and benefits of surgery were discussed with the patient. These include: Infection, bleeding, death, stroke, paralysis, ongoing or worse pain, need for additional surgery, nonunion, leak of spinal fluid, adjacent segment degeneration requiring additional fusion surgery. Pseudoarthrosis (nonunion)requiring supplemental posterior fixation. Throat pain, swallowing difficulties, hoarseness or change in voice. Heterotopic ossification, inability to place the disc due to technical issues requiring bailout to a fusion procedure.

## 2022-06-26 NOTE — Anesthesia Postprocedure Evaluation (Signed)
Anesthesia Post Note  Patient: Teresa Lucas  Procedure(s) Performed: CERVICAL DISC REPLACEMENT CERVICAL FIVE TO SIX (Neck)     Patient location during evaluation: PACU Anesthesia Type: General Level of consciousness: awake and alert Pain management: pain level controlled Vital Signs Assessment: post-procedure vital signs reviewed and stable Respiratory status: spontaneous breathing, nonlabored ventilation, respiratory function stable and patient connected to nasal cannula oxygen Cardiovascular status: blood pressure returned to baseline and stable Postop Assessment: no apparent nausea or vomiting Anesthetic complications: no  No notable events documented.  Last Vitals:  Vitals:   06/26/22 1145 06/26/22 1200  BP: 111/72 110/75  Pulse: 74 73  Resp: 15 14  Temp:  36.8 C  SpO2: 94% 95%    Last Pain:  Vitals:   06/26/22 1200  PainSc: [place]

## 2022-06-26 NOTE — Anesthesia Procedure Notes (Addendum)
Procedure Name: Intubation Date/Time: 06/26/2022 7:47 AM  Performed by: Michele Rockers, CRNAPre-anesthesia Checklist: Patient identified, Patient being monitored, Timeout performed, Emergency Drugs available and Suction available Patient Re-evaluated:Patient Re-evaluated prior to induction Oxygen Delivery Method: Circle System Utilized Preoxygenation: Pre-oxygenation with 100% oxygen Induction Type: IV induction Ventilation: Mask ventilation without difficulty Laryngoscope Size: Mac, 3 and Glidescope Grade View: Grade I Tube type: Oral Tube size: 7.0 mm Number of attempts: 1 Airway Equipment and Method: Video-laryngoscopy and Rigid stylet Placement Confirmation: ETT inserted through vocal cords under direct vision, positive ETCO2 and breath sounds checked- equal and bilateral Secured at: 20 cm Tube secured with: Tape Dental Injury: Teeth and Oropharynx as per pre-operative assessment  Difficulty Due To: Difficult Airway- due to reduced neck mobility

## 2022-06-26 NOTE — Discharge Instructions (Signed)

## 2022-06-27 ENCOUNTER — Encounter (HOSPITAL_COMMUNITY): Payer: Self-pay | Admitting: Orthopedic Surgery

## 2022-08-07 DIAGNOSIS — M542 Cervicalgia: Secondary | ICD-10-CM | POA: Diagnosis not present

## 2022-08-08 DIAGNOSIS — Z4889 Encounter for other specified surgical aftercare: Secondary | ICD-10-CM | POA: Diagnosis not present

## 2022-08-15 DIAGNOSIS — M542 Cervicalgia: Secondary | ICD-10-CM | POA: Diagnosis not present

## 2022-08-22 DIAGNOSIS — M542 Cervicalgia: Secondary | ICD-10-CM | POA: Diagnosis not present

## 2022-08-24 DIAGNOSIS — M542 Cervicalgia: Secondary | ICD-10-CM | POA: Diagnosis not present

## 2022-08-28 DIAGNOSIS — M542 Cervicalgia: Secondary | ICD-10-CM | POA: Diagnosis not present

## 2022-08-30 DIAGNOSIS — M542 Cervicalgia: Secondary | ICD-10-CM | POA: Diagnosis not present

## 2022-09-04 DIAGNOSIS — M542 Cervicalgia: Secondary | ICD-10-CM | POA: Diagnosis not present

## 2022-09-06 DIAGNOSIS — M542 Cervicalgia: Secondary | ICD-10-CM | POA: Diagnosis not present

## 2022-09-11 DIAGNOSIS — M542 Cervicalgia: Secondary | ICD-10-CM | POA: Diagnosis not present

## 2022-09-13 DIAGNOSIS — M542 Cervicalgia: Secondary | ICD-10-CM | POA: Diagnosis not present

## 2022-09-19 DIAGNOSIS — Z4889 Encounter for other specified surgical aftercare: Secondary | ICD-10-CM | POA: Diagnosis not present

## 2022-09-21 DIAGNOSIS — M542 Cervicalgia: Secondary | ICD-10-CM | POA: Diagnosis not present

## 2022-09-25 DIAGNOSIS — M542 Cervicalgia: Secondary | ICD-10-CM | POA: Diagnosis not present

## 2022-10-18 DIAGNOSIS — M542 Cervicalgia: Secondary | ICD-10-CM | POA: Diagnosis not present

## 2022-11-26 ENCOUNTER — Encounter (HOSPITAL_BASED_OUTPATIENT_CLINIC_OR_DEPARTMENT_OTHER): Payer: Self-pay | Admitting: Emergency Medicine

## 2022-11-26 ENCOUNTER — Emergency Department (HOSPITAL_BASED_OUTPATIENT_CLINIC_OR_DEPARTMENT_OTHER): Payer: Medicaid Other

## 2022-11-26 ENCOUNTER — Other Ambulatory Visit: Payer: Self-pay

## 2022-11-26 ENCOUNTER — Emergency Department (HOSPITAL_BASED_OUTPATIENT_CLINIC_OR_DEPARTMENT_OTHER)
Admission: EM | Admit: 2022-11-26 | Discharge: 2022-11-26 | Disposition: A | Discharge status: Home / Self Care | Payer: Medicaid Other | Attending: Emergency Medicine | Admitting: Emergency Medicine

## 2022-11-26 DIAGNOSIS — X58XXXA Exposure to other specified factors, initial encounter: Secondary | ICD-10-CM | POA: Diagnosis not present

## 2022-11-26 DIAGNOSIS — M79661 Pain in right lower leg: Secondary | ICD-10-CM | POA: Diagnosis not present

## 2022-11-26 DIAGNOSIS — S86911A Strain of unspecified muscle(s) and tendon(s) at lower leg level, right leg, initial encounter: Secondary | ICD-10-CM | POA: Diagnosis not present

## 2022-11-26 DIAGNOSIS — R252 Cramp and spasm: Secondary | ICD-10-CM | POA: Diagnosis not present

## 2022-11-26 DIAGNOSIS — S86811A Strain of other muscle(s) and tendon(s) at lower leg level, right leg, initial encounter: Secondary | ICD-10-CM | POA: Diagnosis not present

## 2022-11-26 DIAGNOSIS — S86819A Strain of other muscle(s) and tendon(s) at lower leg level, unspecified leg, initial encounter: Secondary | ICD-10-CM | POA: Diagnosis not present

## 2022-11-26 DIAGNOSIS — R2689 Other abnormalities of gait and mobility: Secondary | ICD-10-CM | POA: Diagnosis not present

## 2022-11-26 DIAGNOSIS — M79604 Pain in right leg: Secondary | ICD-10-CM | POA: Diagnosis not present

## 2022-11-26 NOTE — ED Provider Notes (Signed)
[place] EMERGENCY DEPARTMENT AT [facility] Provider Note   CSN: [id number] Arrival date & time: 11/26/22  1847     History  Chief Complaint  Patient presents with   Leg Pain    Teresa Lucas is a 32 y.o. female.  Patient reports she began having discomfort in her right calf last p.m.  Patient reports that she did not have any injury pain started like a bad cramp in her leg.  Patient was seen at urgent care and had an x-ray they advised her to come to the emergency department for an ultrasound to make sure she did not have a DVT  The history is provided by the patient. No language interpreter was used.  Leg Pain Location:  Leg Injury: no   Pain details:    Quality:  Aching   Radiates to:  Does not radiate   Severity:  Moderate   Onset quality:  Gradual   Timing:  Constant   Progression:  Worsening Prior injury to area:  No      Home Medications Prior to Admission medications   Medication Sig Start Date End Date Taking? Authorizing Provider  gabapentin (NEURONTIN) 300 MG capsule Take 300 mg by mouth 3 (three) times daily as needed (pain).    [provider]  levonorgestrel (MIRENA, 52 MG,) 20 MCG/DAY IUD 1 each by Intrauterine route once. 11/08/18   [provider]  ondansetron (ZOFRAN) 4 MG tablet Take 1 tablet (4 mg total) by mouth every 8 (eight) hours as needed for nausea or vomiting. 06/26/22   Venita Lick, MD      Allergies    Prednisone, Shellfish allergy, and Shellfish-derived products    Review of Systems   Review of Systems  All other systems reviewed and are negative.   Physical Exam Updated Vital Signs BP 103/75 (BP Location: Right Arm)   Pulse 61   Temp 98.2 F (36.8 C) (Oral)   Resp 18   Wt 50.8 kg   SpO2 99%   BMI 20.49 kg/m  Physical Exam Vitals and nursing note reviewed.  Constitutional:      Appearance: She is well-developed.  HENT:     Head: Normocephalic.  Cardiovascular:     Rate and Rhythm: Normal  rate.  Pulmonary:     Effort: Pulmonary effort is normal.  Abdominal:     General: There is no distension.  Musculoskeletal:        General: Swelling and tenderness present.     Cervical back: Normal range of motion.     Comments: Tender right calf slight swelling neurovascular neurosensory are intact  Skin:    General: Skin is warm.  Neurological:     General: No focal deficit present.     Mental Status: She is alert and oriented to person, place, and time.     ED Results / Procedures / Treatments   Labs (all labs ordered are listed, but only abnormal results are displayed) Labs Reviewed - No data to display  EKG None  Radiology US Venous Img Lower Unilateral Right  Result Date: 11/26/2022 CLINICAL DATA:  Right calf cramping. EXAM: RIGHT LOWER EXTREMITY VENOUS DOPPLER ULTRASOUND TECHNIQUE: Gray-scale sonography with compression, as well as color and duplex ultrasound, were performed to evaluate the deep venous system(s) from the level of the common femoral vein through the popliteal and proximal calf veins. COMPARISON:  None Available. FINDINGS: VENOUS Normal compressibility of the common femoral, superficial femoral, and popliteal veins, as well as the visualized  calf veins. Visualized portions of profunda femoral vein and great saphenous vein unremarkable. No filling defects to suggest DVT on grayscale or color Doppler imaging. Doppler waveforms show normal direction of venous flow, normal respiratory plasticity and response to augmentation. Limited views of the contralateral common femoral vein are unremarkable. OTHER Trace amount of simple fluid is noted within the musculature of the right cuff measuring 6.6 x 2.3 cm. Limitations: none IMPRESSION: 1. No evidence of deep venous thrombosis. 2. Small simple fluid intramuscular fluid collection, possible edema versus other etiology. Electronically Signed   By: Thornell Sartorius M.D.   On: 11/26/2022 20:06    Procedures Procedures     Medications Ordered in ED Medications - No data to display  ED Course/ Medical Decision Making/ A&P                             Medical Decision Making Patient complains of pain in her right calf she was sent here from urgent care for evaluation of possible DVT  Amount and/or Complexity of Data Reviewed Radiology:     Details: Ultrasound shows no evidence of DVT  Risk OTC drugs.           Final Clinical Impression(s) / ED Diagnoses Final diagnoses:  Strain of right calf muscle    Rx / DC Orders ED Discharge Orders     None     An After Visit Summary was printed and given to the patient.     Elson Areas, PA-C 11/26/22 2312    Elayne Snare K, DO 11/26/22 [phone]

## 2022-11-26 NOTE — ED Notes (Signed)
Discharge paperwork given and verbally understood. 

## 2022-11-26 NOTE — ED Triage Notes (Signed)
Pt started with right calf pain/cramp last night and has not subsided. She reports pain/swelling.

## 2023-04-23 ENCOUNTER — Ambulatory Visit: Payer: Medicaid Other | Admitting: Family Medicine

## 2023-04-23 ENCOUNTER — Encounter: Payer: Self-pay | Admitting: Family Medicine

## 2023-04-23 ENCOUNTER — Ambulatory Visit
Admission: RE | Admit: 2023-04-23 | Discharge: 2023-04-23 | Disposition: A | Discharge status: Home / Self Care | Payer: Medicaid Other | Source: Ambulatory Visit | Attending: Family Medicine | Admitting: Family Medicine

## 2023-04-23 VITALS — BP 100/68 | HR 71 | Temp 98.4°F | Resp 16 | Ht 62.0 in | Wt 120.4 lb

## 2023-04-23 DIAGNOSIS — R0602 Shortness of breath: Secondary | ICD-10-CM

## 2023-04-23 DIAGNOSIS — R1012 Left upper quadrant pain: Secondary | ICD-10-CM

## 2023-04-23 DIAGNOSIS — Z0000 Encounter for general adult medical examination without abnormal findings: Secondary | ICD-10-CM | POA: Diagnosis not present

## 2023-04-23 NOTE — Patient Instructions (Addendum)
It was very nice to see you today!  get X-ray/labs at [facility].  [address]  hours 8=M-F 8:30-5.  closed 12:30-1 lunch    PLEASE NOTE:  If you had any lab tests please let us know if you have not heard back within a few days. You may see your results on MyChart before we have a chance to review them but we will give you a call once they are reviewed by us. If we ordered any referrals today, please let us know if you have not heard from their office within the next week.   Please try these tips to maintain a healthy lifestyle:  Eat most of your calories during the day when you are active. Eliminate processed foods including packaged sweets (pies, cakes, cookies), reduce intake of potatoes, white bread, white pasta, and white rice. Look for whole grain options, oat flour or almond flour.  Each meal should contain half fruits/vegetables, one quarter protein, and one quarter carbs (no bigger than a computer mouse).  Cut down on sweet beverages. This includes juice, soda, and sweet tea. Also watch fruit intake, though this is a healthier sweet option, it still contains natural sugar! Limit to 3 servings daily.  Drink at least 1 glass of water with each meal and aim for at least 8 glasses per day  Exercise at least 150 minutes every week.   

## 2023-04-23 NOTE — Progress Notes (Signed)
Phone [phone]   Subjective:   Patient is a 32 y.o. female presenting for annual physical.    Chief Complaint  Patient presents with   Annual Exam    CPE Fasting   Annual - Is not regularly exercising.   Upper Left Quadrant Pain - Reports pain near her left rib cage/upper left quadrant when being over. Compares it to pain she felt during pregnancy. Accompanied SOB. Denies heart burn.   See problem oriented charting- ROS- ROS: Gen: no fever, chills  Skin: no rash, itching ENT: no ear pain, ear drainage, nasal congestion, rhinorrhea, sinus pressure, sore throat Eyes: no blurry vision, double vision Resp: no cough, wheeze,+SOB-when bending over only CV: no CP, palpitations, LE edema,  GI: no heartburn, n/v/d/c, +abd pain-when bending over only GU: no dysuria, urgency, frequency, hematuria MSK: no joint pain, myalgias, back pain Neuro: no dizziness, headache, weakness, vertigo Psych: no depression, anxiety, insomnia, SI.  Pt boyfiend concerned about "sleeping patterns".  Whole life, pt sleeps 10+ hours.  Goes to bed about 1130 pm so she can have "me time" after kids go to bed so sleeps later in day.    Sleeps fine.    The following were reviewed and entered/updated in epic: Past Medical History:  Diagnosis Date   Allergy    Anemia    Arthritis    Miscarriage    Patient Active Problem List   Diagnosis Date Noted   Recurrent urinary tract infection 04/15/2022   Pain in pelvis 09/27/2021   Normal labor 09/26/2018   SVD (spontaneous vaginal delivery) 01/19/2016   Pregnancy 01/17/2016   ASCUS with positive high risk HPV cervical 10/20/2015   Group B streptococcal bacteriuria 09/20/2015   SYNCOPE 08/19/2008   DYSPNEA 08/19/2008   Past Surgical History:  Procedure Laterality Date   CERVICAL DISC ARTHROPLASTY N/A 06/26/2022   Procedure: CERVICAL DISC REPLACEMENT CERVICAL FIVE TO SIX;  Surgeon: Venita Lick, MD;  Location: [facility] OR;  Service: Orthopedics;  Laterality: N/A;   3 hrs   DILATION AND CURETTAGE OF UTERUS      Family History  Problem Relation Age of Onset   COPD Father    Heart disease Paternal Aunt 82   Heart disease Paternal Grandmother 10       CAD and afib    Medications- reviewed and updated Current Outpatient Medications  Medication Sig Dispense Refill   levonorgestrel (MIRENA, 52 MG,) 20 MCG/DAY IUD 1 each by Intrauterine route once.     No current facility-administered medications for this visit.    Allergies-reviewed and updated Allergies  Allergen Reactions   Prednisone Nausea And Vomiting, Palpitations and Other (See Comments)   Shellfish-Derived Products Other (See Comments) and Nausea And Vomiting    Other Reaction(s): GI Intolerance   Shellfish Allergy Nausea And Vomiting    Social History   Social History Narrative   .  Has boyfriend   Objective  Objective:  BP 100/68   Pulse 71   Temp 98.4 F (36.9 C) (Temporal)   Resp 16   Ht 5\' 2"  (1.575 m)   Wt 120 lb 6 oz (54.6 kg)   SpO2 98%   BMI 22.02 kg/m  Physical Exam  Gen: WDWN NAD HEENT: NCAT, conjunctiva not injected, sclera nonicteric TM WNL B, OP moist, no exudates  NECK:  supple, no thyromegaly, no nodes, no carotid bruits CARDIAC: RRR, S1S2+, no murmur. DP 2+B LUNGS: CTAB. No wheezes ABDOMEN:  BS+, soft, sl tender upper abd, No HSM, no masses EXT:  no edema MSK: no gross abnormalities. MS 5/5 all 4 NEURO: A&O x3.  CN II-XII intact.  PSYCH: normal mood. Good eye contact    Assessment and Plan   Health Maintenance counseling: 1. Anticipatory guidance: Patient counseled regarding regular dental exams q6 months, eye exams,  avoiding smoking and second hand smoke, limiting alcohol to 1 beverage per day, no illicit drugs.   2. Risk factor reduction:  Advised patient of need for regular exercise and diet rich and fruits and vegetables to reduce risk of heart attack and stroke. Exercise- Not regularly exercising.  Wt Readings from Last 3 Encounters:   04/23/23 120 lb 6 oz (54.6 kg)  11/26/22 112 lb (50.8 kg)  06/26/22 115 lb (52.2 kg)   3. Immunizations/screenings/ancillary studies Immunization History  Administered Date(s) Administered   DTP 09/01/1991, 11/25/1991, 02/15/1993, 09/23/1993   DTaP 12/28/1995   HIB, Unspecified 09/01/1991, 11/25/1991   Hep B, Unspecified 08/20/1997, 03/17/2003, 09/16/2003   MMR 09/01/1991, 12/28/1995   OPV 09/01/1991, 11/25/1991, 02/15/1993, 09/23/1993, 12/28/1995   PFIZER(Purple Top)SARS-COV-2 Vaccination 02/16/2020, 03/08/2020   Tdap 01/14/2009, 01/20/2016, 07/16/2018   There are no preventive care reminders to display for this patient.   4. Cervical cancer screening: utd 5. Skin cancer screening- advised regular sunscreen use. Denies worrisome, changing, or new skin lesions.  6. Birth control/STD check: IUD 7. Smoking associated screening: non smoker 8. Alcohol screening: n/a  Wellness examination  LUQ pain -     DG Chest 2 View; Future -     DG Abd 1 View; Future   Wellness-antic guidance.  RHM UTD.  Will defer labs this year as normal last yr Upper abd pain/SOB when bends over.  Check x-rays to r/o poss large HH/other.   Sleep-advised to try to go to bed earlier.  Recommended follow up: Return in about 1 year (around 04/22/2024) for annual physical.  Lab/Order associations: fasting  I,Anaiya N Rice,acting as a scribe for Angelena Sole, MD.,have documented all relevant documentation on the behalf of Angelena Sole, MD,as directed by  Angelena Sole, MD while in the presence of Angelena Sole, MD.  I, Angelena Sole, MD, have reviewed all documentation for this visit. The documentation on 04/23/23 for the exam, diagnosis, procedures, and orders are all accurate and complete.   Angelena Sole, MD

## 2023-05-06 NOTE — Progress Notes (Signed)
Nothing specific except a lot of constipation.  Take benefiber daily and drink plenty of water

## 2023-05-15 DIAGNOSIS — M5412 Radiculopathy, cervical region: Secondary | ICD-10-CM | POA: Diagnosis not present

## 2023-05-15 DIAGNOSIS — M542 Cervicalgia: Secondary | ICD-10-CM | POA: Diagnosis not present

## 2023-06-23 DIAGNOSIS — M5412 Radiculopathy, cervical region: Secondary | ICD-10-CM | POA: Diagnosis not present

## 2023-06-27 DIAGNOSIS — M542 Cervicalgia: Secondary | ICD-10-CM | POA: Diagnosis not present

## 2023-11-12 DIAGNOSIS — Z0000 Encounter for general adult medical examination without abnormal findings: Secondary | ICD-10-CM | POA: Diagnosis not present

## 2024-05-12 ENCOUNTER — Encounter: Payer: Medicaid Other | Admitting: Family Medicine

## 2024-05-20 ENCOUNTER — Encounter: Payer: Self-pay | Admitting: Family Medicine

## 2024-06-03 DIAGNOSIS — M79672 Pain in left foot: Secondary | ICD-10-CM | POA: Diagnosis not present

## 2024-06-03 DIAGNOSIS — S99922A Unspecified injury of left foot, initial encounter: Secondary | ICD-10-CM | POA: Diagnosis not present
